# Patient Record
Sex: Female | Born: 1955
Health system: Southern US, Community
[De-identification: ages and names within clinical notes are randomized; demographics above are authoritative.]

## PROBLEM LIST (undated history)

## (undated) DIAGNOSIS — Z8619 Personal history of other infectious and parasitic diseases: Secondary | ICD-10-CM

## (undated) DIAGNOSIS — R87629 Unspecified abnormal cytological findings in specimens from vagina: Secondary | ICD-10-CM

## (undated) DIAGNOSIS — E559 Vitamin D deficiency, unspecified: Secondary | ICD-10-CM

## (undated) DIAGNOSIS — N6019 Diffuse cystic mastopathy of unspecified breast: Secondary | ICD-10-CM

## (undated) DIAGNOSIS — K760 Fatty (change of) liver, not elsewhere classified: Secondary | ICD-10-CM

## (undated) DIAGNOSIS — F32A Depression, unspecified: Secondary | ICD-10-CM

## (undated) DIAGNOSIS — F329 Major depressive disorder, single episode, unspecified: Secondary | ICD-10-CM

## (undated) DIAGNOSIS — E785 Hyperlipidemia, unspecified: Secondary | ICD-10-CM

## (undated) DIAGNOSIS — R311 Benign essential microscopic hematuria: Secondary | ICD-10-CM

## (undated) DIAGNOSIS — T7840XA Allergy, unspecified, initial encounter: Secondary | ICD-10-CM

## (undated) HISTORY — DX: Diffuse cystic mastopathy of unspecified breast: N60.19

## (undated) HISTORY — DX: Major depressive disorder, single episode, unspecified: F32.9

## (undated) HISTORY — DX: Benign essential microscopic hematuria: R31.1

## (undated) HISTORY — PX: TONSILLECTOMY: SHX5217

## (undated) HISTORY — DX: Personal history of other infectious and parasitic diseases: Z86.19

## (undated) HISTORY — DX: Fatty (change of) liver, not elsewhere classified: K76.0

## (undated) HISTORY — DX: Hyperlipidemia, unspecified: E78.5

## (undated) HISTORY — DX: Allergy, unspecified, initial encounter: T78.40XA

## (undated) HISTORY — DX: Depression, unspecified: F32.A

## (undated) HISTORY — DX: Unspecified abnormal cytological findings in specimens from vagina: R87.629

## (undated) HISTORY — DX: Vitamin D deficiency, unspecified: E55.9

---

## 2007-06-24 HISTORY — PX: ABLATION: SHX5711

## 2013-11-21 ENCOUNTER — Encounter: Payer: Self-pay | Admitting: Family

## 2013-11-21 ENCOUNTER — Telehealth: Payer: Self-pay | Admitting: *Deleted

## 2013-11-21 ENCOUNTER — Ambulatory Visit (INDEPENDENT_AMBULATORY_CARE_PROVIDER_SITE_OTHER): Payer: Self-pay | Admitting: Family

## 2013-11-21 ENCOUNTER — Other Ambulatory Visit: Payer: Self-pay | Admitting: Family

## 2013-11-21 VITALS — BP 120/82 | HR 64 | Temp 98.2°F | Resp 14 | Ht 62.25 in | Wt 205.1 lb

## 2013-11-21 DIAGNOSIS — R311 Benign essential microscopic hematuria: Secondary | ICD-10-CM

## 2013-11-21 DIAGNOSIS — E785 Hyperlipidemia, unspecified: Secondary | ICD-10-CM

## 2013-11-21 DIAGNOSIS — R3129 Other microscopic hematuria: Secondary | ICD-10-CM

## 2013-11-21 DIAGNOSIS — E559 Vitamin D deficiency, unspecified: Secondary | ICD-10-CM | POA: Insufficient documentation

## 2013-11-21 DIAGNOSIS — R635 Abnormal weight gain: Secondary | ICD-10-CM

## 2013-11-21 DIAGNOSIS — F3289 Other specified depressive episodes: Secondary | ICD-10-CM

## 2013-11-21 DIAGNOSIS — F329 Major depressive disorder, single episode, unspecified: Secondary | ICD-10-CM

## 2013-11-21 DIAGNOSIS — F32A Depression, unspecified: Secondary | ICD-10-CM

## 2013-11-21 LAB — HEPATIC FUNCTION PANEL
ALBUMIN: 4.2 g/dL (ref 3.5–5.2)
ALK PHOS: 57 U/L (ref 39–117)
ALT: 28 U/L (ref 0–35)
AST: 21 U/L (ref 0–37)
BILIRUBIN INDIRECT: 0.4 mg/dL (ref 0.2–1.2)
Bilirubin, Direct: 0.1 mg/dL (ref 0.0–0.3)
TOTAL PROTEIN: 6.9 g/dL (ref 6.0–8.3)
Total Bilirubin: 0.5 mg/dL (ref 0.2–1.2)

## 2013-11-21 MED ORDER — SERTRALINE HCL 50 MG PO TABS: 50.0000 mg | ORAL_TABLET | Freq: Every day | ORAL | Status: DC

## 2013-11-21 MED ORDER — SIMVASTATIN 20 MG PO TABS: 20.0000 mg | ORAL_TABLET | Freq: Every day | ORAL | Status: DC

## 2013-11-21 NOTE — Assessment & Plan Note (Signed)
Discussed counting calories using myfitness pal and regular exercise. Check TSH.

## 2013-11-21 NOTE — Assessment & Plan Note (Signed)
Reports extensive negative work up in the past and was told benign microscopic hematuria.

## 2013-11-21 NOTE — Telephone Encounter (Signed)
Spoke with phlebotomist (Katrina), she will add test to specimen.

## 2013-11-21 NOTE — Telephone Encounter (Signed)
Message copied by Ronny Flurry on Mon Nov 21, 2013 11:34 AM ------      Message from: O'SULLIVAN, MELISSA      Created: Mon Nov 21, 2013 10:43 AM       Can the lab add on vit D to blood draw please ? Dx is vit d def thanks ------

## 2013-11-21 NOTE — Patient Instructions (Signed)
Please complete lab work prior to leaving. Return to the lab in 1 month fasting for cholesterol level. Schedule physical at your convenience. Welcome to [facility]!

## 2013-11-21 NOTE — Assessment & Plan Note (Signed)
Has been on statin intermittently for 1-2 months. Resume nightly. Tolerating. Check LFT. I have asked her to return in 1 month for FLP.

## 2013-11-21 NOTE — Progress Notes (Signed)
Subjective:    Patient ID: Amanda Oconnor, female    DOB: [birth year], 58 y.o.   MRN: [id number]  HPI  Ms. Amanda Oconnor is a 58 yr old female who presents today to establish care.  1) Depression-  Currently maintained on zoloft. She started this >1 yr ago.  Cared for her mother after her father passed away who had stage 4 lung cancer. Mother has since passed away. She gained a lot of weight at that time.  She was also dealing with brother's mental health issues.  He died last year from a stroke. 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 She was laid off 1 week after her mother passed away.  For the last year she has been intermittently unemployed.  Saw a therapist who recommended that she start zoloft and referred her to an MD to prescribe.  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  Her husband remains in [place] for now. They are hoping to stay here permanently depending on job opportunity.   2) Vit D deficiency- takes weekly supplement.  She started taking 3 months ago.    3) Hyperlipidemia-  On simvastatin.  Reports that she has not been taking regularly with move. Started 2 months ago.  Last dose >1 week ago.  Denies myalgia.    4) Microscopic hematuria- reports that she had an extensive work up.  Had a scan and developed shortness of breath.    5) weight gain- reports steady weight gain over the last 5 years and 20 pounds in the last year. Walks her dogs regularly and eats healthy.  Review of Systems See HPI  Past Medical History  Diagnosis Date  . History of chicken pox   . Depression   . Allergy   . Hyperlipidemia   . Vitamin D deficiency   . Fibrocystic breast disease     History   Social History  . Marital Status: Married    Spouse Name: N/A    Number of Children: N/A  . Years of Education: N/A   Occupational History  . Not on file.   Social History Main Topics  . Smoking status: Never Smoker   . Smokeless  tobacco: Never Used  . Alcohol Use: Yes     Comment: occasional ( twice a month)  . Drug Use: Not on file  . Sexual Activity: Not on file   Other Topics Concern  . Not on file   Social History Narrative  . No narrative on file    Past Surgical History  Procedure Laterality Date  . Tonsillectomy      age 69  . Ablation  2009    Family History  Problem Relation Age of Onset  . Arthritis Mother   . Cancer Mother     lung  . Hyperlipidemia Mother   . Heart disease Mother   . Diabetes Mother   . Tuberculosis Mother   . Fibrocystic breast disease Mother   . Cancer Father     prostate  . Tuberculosis Father   . Alcohol abuse Father   . Gout Father   . Heart disease Sister   . Stroke Brother   . Heart disease Brother     Allergies  Allergen Reactions  . Contrast Media [Iodinated Diagnostic Agents] Shortness Of Breath  . Penicillins Hives  . Red Dye Rash  . Sulfa Antibiotics Rash    No current outpatient prescriptions on file prior  to visit.   No current facility-administered medications on file prior to visit.    BP 120/82  Pulse 64  Temp(Src) 98.2 F (36.8 C) (Oral)  Resp 14  Ht 5' 2.25" (1.581 m)  Wt 205 lb 1.3 oz (93.024 kg)  BMI 37.22 kg/m2  SpO2 98%       Objective:   Physical Exam  Constitutional: She is oriented to person, place, and time. She appears well-developed and well-nourished. No distress.  HENT:  Head: Normocephalic and atraumatic.  Right Ear: Tympanic membrane and ear canal normal.  Left Ear: Tympanic membrane and ear canal normal.  Mouth/Throat: No oropharyngeal exudate, posterior oropharyngeal edema or posterior oropharyngeal erythema.  Cardiovascular: Normal rate and regular rhythm.   No murmur heard. Pulmonary/Chest: Effort normal and breath sounds normal. No respiratory distress. She has no wheezes. She has no rales. She exhibits no tenderness.  Musculoskeletal: She exhibits no edema.  Lymphadenopathy:    She has no  cervical adenopathy.  Neurological: She is alert and oriented to person, place, and time.  Skin: Skin is warm and dry.  Psychiatric: She has a normal mood and affect. Her behavior is normal. Judgment and thought content normal.          Assessment & Plan:

## 2013-11-21 NOTE — Progress Notes (Signed)
Pre visit review using our clinic review tool, if applicable. No additional management support is needed unless otherwise documented below in the visit note. 

## 2013-11-21 NOTE — Assessment & Plan Note (Signed)
Will see if lab can add on vit d level.

## 2013-11-21 NOTE — Assessment & Plan Note (Signed)
Maintained on zoloft. Stable. Continue same.

## 2013-11-22 ENCOUNTER — Encounter: Payer: Self-pay | Admitting: Family

## 2013-11-22 LAB — TSH: TSH: 2.721 u[IU]/mL (ref 0.350–4.500)

## 2013-11-22 LAB — VITAMIN D 25 HYDROXY (VIT D DEFICIENCY, FRACTURES): Vit D, 25-Hydroxy: 39 ng/mL (ref 30–89)

## 2013-11-28 ENCOUNTER — Telehealth: Payer: Self-pay | Admitting: *Deleted

## 2013-11-28 DIAGNOSIS — E785 Hyperlipidemia, unspecified: Secondary | ICD-10-CM

## 2013-11-28 NOTE — Telephone Encounter (Signed)
Message copied by Ronny Flurry on Mon Nov 28, 2013  8:12 AM ------      Message from: O'SULLIVAN, MELISSA      Created: Mon Nov 21, 2013 10:42 AM       Pt will follow up in 1 month for flp dx hyperlipidemia please.  ------

## 2014-05-30 ENCOUNTER — Other Ambulatory Visit: Payer: Self-pay | Admitting: Family

## 2014-06-19 ENCOUNTER — Telehealth: Payer: Self-pay | Admitting: Family

## 2014-06-19 ENCOUNTER — Other Ambulatory Visit: Payer: Self-pay | Admitting: Family

## 2014-06-19 NOTE — Telephone Encounter (Signed)
Left a message for call back.  

## 2014-06-19 NOTE — Telephone Encounter (Signed)
Needs an office visit for recheck of chronic conditions with Earlie Counts.

## 2014-06-26 ENCOUNTER — Ambulatory Visit (INDEPENDENT_AMBULATORY_CARE_PROVIDER_SITE_OTHER): Payer: BLUE CROSS/BLUE SHIELD | Admitting: Family

## 2014-06-26 ENCOUNTER — Encounter: Payer: Self-pay | Admitting: Family

## 2014-06-26 VITALS — BP 104/78 | HR 62 | Temp 98.2°F | Resp 16 | Ht 62.25 in | Wt 210.4 lb

## 2014-06-26 DIAGNOSIS — E559 Vitamin D deficiency, unspecified: Secondary | ICD-10-CM

## 2014-06-26 DIAGNOSIS — F32A Depression, unspecified: Secondary | ICD-10-CM

## 2014-06-26 DIAGNOSIS — E162 Hypoglycemia, unspecified: Secondary | ICD-10-CM | POA: Insufficient documentation

## 2014-06-26 DIAGNOSIS — F329 Major depressive disorder, single episode, unspecified: Secondary | ICD-10-CM

## 2014-06-26 DIAGNOSIS — E785 Hyperlipidemia, unspecified: Secondary | ICD-10-CM

## 2014-06-26 NOTE — Patient Instructions (Addendum)
Please schedule fasting lab work on your way out. Schedule physical at the front desk. Happy New Year!

## 2014-06-26 NOTE — Progress Notes (Signed)
Pre visit review using our clinic review tool, if applicable. No additional management support is needed unless otherwise documented below in the visit note. 

## 2014-06-26 NOTE — Progress Notes (Signed)
Subjective:    Patient ID: Amanda Oconnor, female    DOB: 09/04/1955, 59 y.o.   MRN: [id number]  HPI  Amanda Oconnor is a 59 yr old female who presents today for follow up.  1) Depression-maintained on zoloft. Husband is now here in [place].  She is working in [employer] in [employer]. 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  Reports that she feels that the zoloft is helping.   2) hyperlipidemia- has been out of  Simvastatin for several months. Expects her cholesterol to be elevated.  3) Hypoglycemia- reports that if she eats something really sweet, she will later get shakey. Has to avoid things such as oj.   She reports that she did have some bronchitis symptoms recently and was evaluated by Urgent care who placed her on omnicef.  Review of Systems See HPI  Past Medical History  Diagnosis Date  . History of chicken pox   . Depression   . Allergy   . Hyperlipidemia   . Vitamin D deficiency   . Fibrocystic breast disease   . Benign microscopic hematuria     per pt she has had extensive w/u which was negative    History   Social History  . Marital Status: Married    Spouse Name: N/A    Number of Children: N/A  . Years of Education: N/A   Occupational History  . Not on file.   Social History Main Topics  . Smoking status: Never Smoker   . Smokeless tobacco: Never Used  . Alcohol Use: Yes     Comment: occasional ( twice a month)  . Drug Use: Not on file  . Sexual Activity: Not on file   Other Topics Concern  . Not on file   Social History Narrative   3 children   5 grandchildren   Married second marriage.  Husband is in [place].   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 Tonsillectomy      age 25  . Ablation  2009    Family History  Problem Relation Age of Onset  . Arthritis Mother   . Cancer Mother     lung  .  Hyperlipidemia Mother   . Heart disease Mother   . Diabetes Mother   . Tuberculosis Mother   . Fibrocystic breast disease Mother   . Cancer Father     prostate  . Tuberculosis Father   . Alcohol abuse Father   . Gout Father   . Heart disease Sister   . Stroke Brother   . Heart disease Brother     Allergies  Allergen Reactions  . Contrast Media [Iodinated Diagnostic Agents] Shortness Of Breath  . Penicillins Hives  . Sulfa Antibiotics Rash    Current Outpatient Prescriptions on File Prior to Visit  Medication Sig Dispense Refill  . sertraline (ZOLOFT) 50 MG tablet TAKE 1 TABLET (50 MG TOTAL) BY MOUTH DAILY. 30 tablet 0  . Vitamin D, Ergocalciferol, (DRISDOL) 50000 UNITS CAPS capsule Take 50,000 Units by mouth every 7 (seven) days.    . simvastatin (ZOCOR) 20 MG tablet Take 1 tablet (20 mg total) by mouth daily. (Patient not taking: Reported on 06/26/2014) 30 tablet 5   No current facility-administered medications on file prior to visit.    BP 104/78 mmHg  Pulse 62  Temp(Src)  98.2 F (36.8 C) (Oral)  Resp 16  Ht 5' 2.25" (1.581 m)  Wt 210 lb 6.4 oz (95.437 kg)  BMI 38.18 kg/m2  SpO2 97%       Objective:   Physical Exam  Constitutional: She is oriented to person, place, and time. She appears well-developed and well-nourished. No distress.  HENT:  Head: Normocephalic and atraumatic.  Cardiovascular: Normal rate and regular rhythm.   No murmur heard. Pulmonary/Chest: Effort normal and breath sounds normal. No respiratory distress. She has no wheezes. She has no rales. She exhibits no tenderness.  Musculoskeletal: She exhibits no edema.  Lymphadenopathy:    She has no cervical adenopathy.  Neurological: She is alert and oriented to person, place, and time.  Psychiatric: She has a normal mood and affect. Her behavior is normal. Judgment and thought content normal.          Assessment & Plan:  Declines flu shot

## 2014-06-26 NOTE — Assessment & Plan Note (Signed)
Will obtain A1C to further evaluate.

## 2014-06-26 NOTE — Assessment & Plan Note (Signed)
Not currently taking supplement. Check follow up vit D level.

## 2014-06-26 NOTE — Telephone Encounter (Signed)
Pt seen for annual exam today.

## 2014-06-26 NOTE — Assessment & Plan Note (Signed)
She will return fasting for lipid panel. If elevated, plan to repeat statin.

## 2014-06-26 NOTE — Assessment & Plan Note (Signed)
Stable on zoloft. Continue same.  

## 2014-07-04 ENCOUNTER — Telehealth: Payer: Self-pay | Admitting: Family

## 2014-07-04 ENCOUNTER — Other Ambulatory Visit: Payer: Self-pay

## 2014-07-04 DIAGNOSIS — E559 Vitamin D deficiency, unspecified: Secondary | ICD-10-CM

## 2014-07-04 DIAGNOSIS — E785 Hyperlipidemia, unspecified: Secondary | ICD-10-CM

## 2014-07-04 LAB — LIPID PANEL
Cholesterol: 270 mg/dL — ABNORMAL HIGH (ref 0–200)
HDL: 45.9 mg/dL (ref >39.00)
LDL Cholesterol: 199 mg/dL — ABNORMAL HIGH (ref 0–99)
NonHDL: 224.1
Total CHOL/HDL Ratio: 6
Triglycerides: 124 mg/dL (ref 0.0–149.0)
VLDL: 24.8 mg/dL (ref 0.0–40.0)

## 2014-07-04 LAB — HEMOGLOBIN A1C: Hgb A1c MFr Bld: 6.3 % (ref 4.6–6.5)

## 2014-07-04 LAB — VITAMIN D 25 HYDROXY (VIT D DEFICIENCY, FRACTURES): VITD: 17.79 ng/mL — ABNORMAL LOW (ref 30.00–100.00)

## 2014-07-04 MED ORDER — VITAMIN D (ERGOCALCIFEROL) 1.25 MG (50000 UNIT) PO CAPS
50000.0000 [IU] | ORAL_CAPSULE | ORAL | Status: DC
Start: 2014-07-04 — End: 2015-02-07

## 2014-07-04 NOTE — Telephone Encounter (Signed)
Vitamin D is low.  Start weekly supplement (rx sent) x 12 weeks, repeat vit D in 12 weeks.  Cholesterol is elevated.  Restart statin. Repeat flp in 12 weeks.  Dx hyperlipidemia.

## 2014-07-05 NOTE — Telephone Encounter (Signed)
Left message for pt to return my call.

## 2014-07-07 MED ORDER — SIMVASTATIN 20 MG PO TABS: 20.0000 mg | ORAL_TABLET | Freq: Every day | ORAL | Status: DC

## 2014-07-07 NOTE — Telephone Encounter (Signed)
Notified pt and she voices understanding. Requests rx of simvastatin; refill sent. Lab apt scheduled for 09/29/14 and future lab order entered.

## 2014-07-24 ENCOUNTER — Encounter: Payer: BC Managed Care – PPO | Admitting: Family

## 2014-08-03 ENCOUNTER — Other Ambulatory Visit: Payer: Self-pay | Admitting: Family

## 2014-08-07 ENCOUNTER — Telehealth: Payer: Self-pay | Admitting: Family

## 2014-08-07 NOTE — Telephone Encounter (Signed)
Caller name: Tabitha, Riggins Relation to pt: self Call back number: [phone] Pharmacy: [id number] - [place], [place] - [address] AT [address]OSS [address] [phone] (Phone) [phone] (Fax)         Reason for call:  Pt requesting a refill sertraline (ZOLOFT) 50 MG tablet

## 2014-08-07 NOTE — Telephone Encounter (Signed)
Refill sent today. See 08/03/14 refill encounter. Notified pt.

## 2014-08-09 ENCOUNTER — Encounter: Payer: Self-pay | Admitting: Family

## 2014-08-09 ENCOUNTER — Ambulatory Visit (INDEPENDENT_AMBULATORY_CARE_PROVIDER_SITE_OTHER): Payer: BLUE CROSS/BLUE SHIELD | Admitting: Family

## 2014-08-09 VITALS — BP 110/60 | HR 67 | Temp 98.1°F | Resp 16 | Ht 62.5 in | Wt 212.0 lb

## 2014-08-09 DIAGNOSIS — E785 Hyperlipidemia, unspecified: Secondary | ICD-10-CM

## 2014-08-09 DIAGNOSIS — Z1211 Encounter for screening for malignant neoplasm of colon: Secondary | ICD-10-CM

## 2014-08-09 DIAGNOSIS — E559 Vitamin D deficiency, unspecified: Secondary | ICD-10-CM

## 2014-08-09 DIAGNOSIS — J329 Chronic sinusitis, unspecified: Secondary | ICD-10-CM | POA: Insufficient documentation

## 2014-08-09 DIAGNOSIS — Z0000 Encounter for general adult medical examination without abnormal findings: Secondary | ICD-10-CM | POA: Insufficient documentation

## 2014-08-09 LAB — LIPID PANEL
Cholesterol: 180 mg/dL (ref 0–200)
HDL: 47.4 mg/dL (ref >39.00)
LDL CALC: 117 mg/dL — AB (ref 0–99)
NonHDL: 132.6
Total CHOL/HDL Ratio: 4
Triglycerides: 79 mg/dL (ref 0.0–149.0)
VLDL: 15.8 mg/dL (ref 0.0–40.0)

## 2014-08-09 LAB — CBC WITH DIFFERENTIAL/PLATELET
BASOS ABS: 0 10*3/uL (ref 0.0–0.1)
Basophils Relative: 0.4 % (ref 0.0–3.0)
EOS PCT: 1.3 % (ref 0.0–5.0)
Eosinophils Absolute: 0.1 10*3/uL (ref 0.0–0.7)
HCT: 41.9 % (ref 36.0–46.0)
Hemoglobin: 14.1 g/dL (ref 12.0–15.0)
Lymphocytes Relative: 31.3 % (ref 12.0–46.0)
Lymphs Abs: 2.1 10*3/uL (ref 0.7–4.0)
MCHC: 33.7 g/dL (ref 30.0–36.0)
MCV: 88.6 fl (ref 78.0–100.0)
MONOS PCT: 7.8 % (ref 3.0–12.0)
Monocytes Absolute: 0.5 10*3/uL (ref 0.1–1.0)
NEUTROS PCT: 59.2 % (ref 43.0–77.0)
Neutro Abs: 3.9 10*3/uL (ref 1.4–7.7)
PLATELETS: 261 10*3/uL (ref 150.0–400.0)
RBC: 4.73 Mil/uL (ref 3.87–5.11)
RDW: 12.6 % (ref 11.5–15.5)
WBC: 6.6 10*3/uL (ref 4.0–10.5)

## 2014-08-09 LAB — URINALYSIS, ROUTINE W REFLEX MICROSCOPIC
Bilirubin Urine: NEGATIVE
KETONES UR: NEGATIVE
Leukocytes, UA: NEGATIVE
Nitrite: NEGATIVE
RBC / HPF: NONE SEEN (ref 0–?)
Specific Gravity, Urine: 1.01 (ref 1.000–1.030)
TOTAL PROTEIN, URINE-UPE24: NEGATIVE
URINE GLUCOSE: NEGATIVE
Urobilinogen, UA: 0.2 (ref 0.0–1.0)
WBC, UA: NONE SEEN (ref 0–?)
pH: 7 (ref 5.0–8.0)

## 2014-08-09 LAB — HEPATIC FUNCTION PANEL
ALBUMIN: 4.1 g/dL (ref 3.5–5.2)
ALK PHOS: 58 U/L (ref 39–117)
ALT: 31 U/L (ref 0–35)
AST: 21 U/L (ref 0–37)
Bilirubin, Direct: 0.1 mg/dL (ref 0.0–0.3)
TOTAL PROTEIN: 7.3 g/dL (ref 6.0–8.3)
Total Bilirubin: 0.5 mg/dL (ref 0.2–1.2)

## 2014-08-09 LAB — TSH: TSH: 2.7 u[IU]/mL (ref 0.35–4.50)

## 2014-08-09 LAB — VITAMIN D 25 HYDROXY (VIT D DEFICIENCY, FRACTURES): VITD: 33.57 ng/mL (ref 30.00–100.00)

## 2014-08-09 MED ORDER — CALCIUM CARBONATE-VITAMIN D 600-400 MG-UNIT PO TABS: 1.0000 | ORAL_TABLET | Freq: Two times a day (BID) | ORAL | Status: DC

## 2014-08-09 MED ORDER — FLUTICASONE PROPIONATE 50 MCG/ACT NA SUSP: 2.0000 | Freq: Every day | NASAL | Status: DC

## 2014-08-09 MED ORDER — SERTRALINE HCL 100 MG PO TABS: 100.0000 mg | ORAL_TABLET | Freq: Every day | ORAL | Status: DC

## 2014-08-09 NOTE — Progress Notes (Signed)
Pre visit review using our clinic review tool, if applicable. No additional management support is needed unless otherwise documented below in the visit note. 

## 2014-08-09 NOTE — Assessment & Plan Note (Signed)
Discussed adding flonase and possibly referral to ENT, declines ENT referral at this time but is agreeable to flonase.

## 2014-08-09 NOTE — Patient Instructions (Signed)
Increase zoloft from 50mg  to 100mg .  Complete lab work prior to leaving. Add flonase  2 sprays each nostril once daily.  Follow up in 6 months.

## 2014-08-09 NOTE — Assessment & Plan Note (Signed)
Declines colo advised her to complete IFOB though. Refer for dexa, mammogram. Continue healthy diet, add regular exercise. Add caltrate + D 600 mg bid for bone health.

## 2014-08-09 NOTE — Progress Notes (Signed)
Subjective:    Patient ID: Amanda Oconnor, female    DOB: 1956/01/07, 59 y.o.   MRN: [id number]  HPI Amanda Oconnor presents today for complete physical.  Immunizations: declines flu vaccine. Td-2008 Diet: eats fruits/vegetables. Eats healthy. Exercise: not exercising.  Colonoscopy: has never had one; declines Dexa: has never had. Pap Smear: 2 years ago, desires referral to GYN. Mammogram: 2 years ago  1. Depression/anxiety: Maintained on sertraline 50 mg for depression since 2012/10/13 when her mother died. She feels that is as been helpful. Reports high anxiety with her job. Had an emotional outburst at work last week.Has trouble multi-tasking and has some problems with memory since 10/13/2013 when she began current job.  Also had depressed episode over Christmas.  Has not developed support group here. Children live out of town.  2. Hyperlipidemia: Reports compliance with simvastatin. Denies myalgia. Lab Results  Component Value Date   CHOL 270* 07/04/2014   HDL 45.90 07/04/2014   LDLCALC 199* 07/04/2014   TRIG 124.0 07/04/2014   CHOLHDL 6 07/04/2014    4 sinus infections in 1 year  Immunizations:  Tetanus up to date Diet: reports diet is good Exercise: no Colonoscopy:  Declines, agreeable to ifob Dexa:due  Pap Smear: due, refer to gyn Mammogram: due     Review of Systems  Constitutional: Positive for fatigue. Negative for fever and unexpected weight change.  HENT: Positive for congestion.   Eyes: Negative for visual disturbance.  Respiratory: Negative for cough and shortness of breath.   Cardiovascular: Positive for chest pain. Negative for palpitations and leg swelling.       Feels chest pain is related to anxiety. Has had during very anxious episodes.  Gastrointestinal: Negative for abdominal pain, diarrhea and constipation.  Genitourinary: Negative for dysuria and frequency.       Reports vaginal dryness.  Musculoskeletal: Negative for myalgias and arthralgias.    Skin: Negative for rash.  Neurological: Negative for headaches.  Hematological: Does not bruise/bleed easily.   Past Medical History  Diagnosis Date  . History of chicken pox   . Depression   . Allergy   . Hyperlipidemia   . Vitamin D deficiency   . Fibrocystic breast disease   . Benign microscopic hematuria     per pt she has had extensive w/u which was negative    History   Social History  . Marital Status: Married    Spouse Name: N/A  . Number of Children: N/A  . Years of Education: N/A   Occupational History  . Not on file.   Social History Main Topics  . Smoking status: Never Smoker   . Smokeless tobacco: Never Used  . Alcohol Use: Yes     Comment: occasional ( twice a month)  . Drug Use: Not on file  . Sexual Activity: Not on file   Other Topics Concern  . Not on file   Social History Narrative   3 children   5 grandchildren   Married second marriage.  Husband is in [place].   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 Tonsillectomy      age 11  . Ablation  10/14/07    Family History  Problem Relation Age of Onset  . Arthritis Mother   . Cancer Mother     lung  . Hyperlipidemia Mother   .  Heart disease Mother   . Diabetes Mother   . Tuberculosis Mother   . Fibrocystic breast disease Mother   . Cancer Father     prostate  . Tuberculosis Father   . Alcohol abuse Father   . Gout Father   . Heart disease Sister   . Stroke Brother   . Heart disease Brother     Allergies  Allergen Reactions  . Contrast Media [Iodinated Diagnostic Agents] Shortness Of Breath  . Penicillins Hives  . Sulfa Antibiotics Rash    Current Outpatient Prescriptions on File Prior to Visit  Medication Sig Dispense Refill  . simvastatin (ZOCOR) 20 MG tablet Take 1 tablet (20 mg total) by mouth daily. 30 tablet 5  . Vitamin D,  Ergocalciferol, (DRISDOL) 50000 UNITS CAPS capsule Take 1 capsule (50,000 Units total) by mouth every 7 (seven) days. 12 capsule 0   No current facility-administered medications on file prior to visit.    BP 110/60 mmHg  Pulse 67  Temp(Src) 98.1 F (36.7 C) (Oral)  Resp 16  Ht 5' 2.5" (1.588 m)  Wt 212 lb (96.163 kg)  BMI 38.13 kg/m2  SpO2 98%       Objective:   Physical Exam  Physical Exam  Constitutional: She is oriented to person, place, and time. She appears well-developed and well-nourished. No distress.  HENT:  Head: Normocephalic and atraumatic.  Right Ear: Tympanic membrane and ear canal normal.  Left Ear: Tympanic membrane and ear canal normal.  Mouth/Throat: Oropharynx is clear and moist.  Eyes: Pupils are equal, round, and reactive to light. No scleral icterus.  Neck: Normal range of motion. No thyromegaly present.  Cardiovascular: Normal rate and regular rhythm.   No murmur heard. Pulmonary/Chest: Effort normal and breath sounds normal. No respiratory distress. He has no wheezes. She has no rales. She exhibits no tenderness.  Abdominal: Soft. Bowel sounds are normal. He exhibits no distension and no mass. There is no tenderness. There is no rebound and no guarding.  Musculoskeletal: She exhibits no edema.  Lymphadenopathy:    She has no cervical adenopathy.  Neurological: She is alert and oriented to person, place, and time. She has normal reflexes. She exhibits normal muscle tone. Coordination normal.  Skin: Skin is warm and dry.  Psychiatric: She has a normal mood and affect. Her behavior is normal. Judgment and thought content normal.  Breasts: Examined lying Right: Without masses, retractions, discharge or axillary adenopathy.  Left: Without masses, retractions, discharge or axillary adenopathy.  Inguinal/mons: Normal without inguinal adenopathy  External genitalia: Normal  BUS/Urethra/Skene's glands: Normal  Bladder: Normal  Vagina: Normal  Cervix:  Normal  Uterus: normal in size, shape and contour. Midline and mobile  Adnexa/parametria:  Rt: Without masses or tenderness.  Lt: Without masses or tenderness.  Anus and perineum: Normal           Assessment & Plan:         Assessment & Plan:

## 2014-08-09 NOTE — Assessment & Plan Note (Signed)
Tolerating statin, continue same.  

## 2014-08-09 NOTE — Addendum Note (Signed)
Addended by: Peggyann Shoals on: 08/09/2014 02:35 PM   Modules accepted: Orders

## 2014-08-10 ENCOUNTER — Encounter: Payer: Self-pay | Admitting: Family

## 2014-08-23 ENCOUNTER — Other Ambulatory Visit: Payer: BLUE CROSS/BLUE SHIELD

## 2014-08-23 DIAGNOSIS — Z1211 Encounter for screening for malignant neoplasm of colon: Secondary | ICD-10-CM

## 2014-08-23 LAB — FECAL OCCULT BLOOD, IMMUNOCHEMICAL: Fecal Occult Bld: POSITIVE — AB

## 2014-08-25 ENCOUNTER — Encounter: Payer: Self-pay | Admitting: Family

## 2014-08-25 ENCOUNTER — Telehealth: Payer: Self-pay | Admitting: Family

## 2014-08-25 DIAGNOSIS — R195 Other fecal abnormalities: Secondary | ICD-10-CM | POA: Insufficient documentation

## 2014-08-25 NOTE — Telephone Encounter (Signed)
ifob shows blood in her stool.  I would highly recommend referral to GI for further evaluation.  I have pended referral below.

## 2014-08-25 NOTE — Telephone Encounter (Signed)
Notified pt. She states she has a lot of medical bills that she is currently paying and will get back with [place] when she is ready to proceed with GI referral.  Advised pt of potential causes for blood stool and importance of having further workup. Pt voices understanding and state she will let us know when she is ready to proceed.

## 2014-08-30 ENCOUNTER — Encounter: Payer: Self-pay | Admitting: Family

## 2014-09-22 HISTORY — PX: TOOTH EXTRACTION: SHX859

## 2014-09-29 ENCOUNTER — Other Ambulatory Visit: Payer: BLUE CROSS/BLUE SHIELD

## 2014-11-15 ENCOUNTER — Emergency Department (HOSPITAL_COMMUNITY)
Admission: EM | Admit: 2014-11-15 | Discharge: 2014-11-15 | Disposition: A | Discharge status: Home / Self Care | Payer: Worker's Compensation | Attending: Emergency Medicine | Admitting: Emergency Medicine

## 2014-11-15 ENCOUNTER — Emergency Department (HOSPITAL_COMMUNITY): Payer: Worker's Compensation

## 2014-11-15 ENCOUNTER — Encounter (HOSPITAL_COMMUNITY): Payer: Self-pay | Admitting: Family Medicine

## 2014-11-15 DIAGNOSIS — E785 Hyperlipidemia, unspecified: Secondary | ICD-10-CM | POA: Diagnosis not present

## 2014-11-15 DIAGNOSIS — Y990 Civilian activity done for income or pay: Secondary | ICD-10-CM | POA: Diagnosis not present

## 2014-11-15 DIAGNOSIS — Y9301 Activity, walking, marching and hiking: Secondary | ICD-10-CM | POA: Diagnosis not present

## 2014-11-15 DIAGNOSIS — W010XXA Fall on same level from slipping, tripping and stumbling without subsequent striking against object, initial encounter: Secondary | ICD-10-CM | POA: Insufficient documentation

## 2014-11-15 DIAGNOSIS — S8992XA Unspecified injury of left lower leg, initial encounter: Secondary | ICD-10-CM | POA: Diagnosis present

## 2014-11-15 DIAGNOSIS — Z79899 Other long term (current) drug therapy: Secondary | ICD-10-CM | POA: Diagnosis not present

## 2014-11-15 DIAGNOSIS — E559 Vitamin D deficiency, unspecified: Secondary | ICD-10-CM | POA: Diagnosis not present

## 2014-11-15 DIAGNOSIS — E329 Disease of thymus, unspecified: Secondary | ICD-10-CM | POA: Diagnosis not present

## 2014-11-15 DIAGNOSIS — Z7951 Long term (current) use of inhaled steroids: Secondary | ICD-10-CM | POA: Insufficient documentation

## 2014-11-15 DIAGNOSIS — Z880 Allergy status to penicillin: Secondary | ICD-10-CM | POA: Insufficient documentation

## 2014-11-15 DIAGNOSIS — Z8742 Personal history of other diseases of the female genital tract: Secondary | ICD-10-CM | POA: Insufficient documentation

## 2014-11-15 DIAGNOSIS — Z8619 Personal history of other infectious and parasitic diseases: Secondary | ICD-10-CM | POA: Insufficient documentation

## 2014-11-15 DIAGNOSIS — Y92000 Kitchen of unspecified non-institutional (private) residence as  the place of occurrence of the external cause: Secondary | ICD-10-CM | POA: Insufficient documentation

## 2014-11-15 MED ORDER — IBUPROFEN 800 MG PO TABS: 800.0000 mg | ORAL_TABLET | Freq: Three times a day (TID) | ORAL | Status: DC | PRN

## 2014-11-15 MED ORDER — HYDROCODONE-ACETAMINOPHEN 5-325 MG PO TABS: 1.0000 | ORAL_TABLET | Freq: Four times a day (QID) | ORAL | Status: DC | PRN

## 2014-11-15 NOTE — ED Notes (Signed)
Per EMS, pt was walking and had a fall. Their was water on the floor and she landed on her left knee. Denies any other injuries. IV was established and given Fentanyl 239mcq (divided doses of 49mcq) en route. Patients oxygen saturation dropped in the 80's when given medication. Placed on 2L oxygen via [place] and O2 increased.

## 2014-11-15 NOTE — ED Provider Notes (Signed)
CSN: [id number]     Arrival date & time 11/15/14  1934 History   First MD Initiated Contact with Patient 11/15/14 2000     Chief Complaint  Patient presents with  . Fall  . Knee Injury     (Consider location/radiation/quality/duration/timing/severity/associated sxs/prior Treatment) HPI Patient presents to the emergency department with right knee pain from a fall that occurred just prior to arrival.  The patient states she was at work when she slipped on a wet floor in the kitchen and she landed directly on her left knee.  The patient states that she was unable to get off the floor.  EMS was called and they gave her 200 g of fentanyl.  Patient states that movement and palpation make the pain worse.  The patient denies any other injuries.  She states that she has never injured injured this knee in the past Past Medical History  Diagnosis Date  . History of chicken pox   . Depression   . Allergy   . Hyperlipidemia   . Vitamin D deficiency   . Fibrocystic breast disease   . Benign microscopic hematuria     per pt she has had extensive w/u which was negative   Past Surgical History  Procedure Laterality Date  . Tonsillectomy      age 30  . Ablation  2009   Family History  Problem Relation Age of Onset  . Arthritis Mother   . Cancer Mother     lung  . Hyperlipidemia Mother   . Heart disease Mother   . Diabetes Mother   . Tuberculosis Mother   . Fibrocystic breast disease Mother   . Cancer Father     prostate  . Tuberculosis Father   . Alcohol abuse Father   . Gout Father   . Heart disease Sister   . Stroke Brother   . Heart disease Brother    History  Substance Use Topics  . Smoking status: Never Smoker   . Smokeless tobacco: Never Used  . Alcohol Use: No   OB History    No data available     Review of Systems  All other systems negative except as documented in the HPI. All pertinent positives and negatives as reviewed in the HPI.  Allergies  Contrast media;  Penicillins; and Sulfa antibiotics  Home Medications   Prior to Admission medications   Medication Sig Start Date End Date Taking? Authorizing Provider  Calcium Carbonate-Vitamin D (CALTRATE 600+D) 600-400 MG-UNIT per tablet Take 1 tablet by mouth 2 (two) times daily. 08/09/14  Yes Debbrah Alar, NP  fluticasone (FLONASE) 50 MCG/ACT nasal spray Place 2 sprays into both nostrils daily. 08/09/14  Yes Debbrah Alar, NP  sertraline (ZOLOFT) 100 MG tablet Take 1 tablet (100 mg total) by mouth daily. 08/09/14  Yes Debbrah Alar, NP  simvastatin (ZOCOR) 20 MG tablet Take 1 tablet (20 mg total) by mouth daily. 07/07/14  Yes Debbrah Alar, NP  Vitamin D, Ergocalciferol, (DRISDOL) 50000 UNITS CAPS capsule Take 1 capsule (50,000 Units total) by mouth every 7 (seven) days. Patient not taking: Reported on 11/15/2014 07/04/14   Debbrah Alar, NP   BP 103/56 mmHg  Pulse 62  Temp(Src) 98.1 F (36.7 C) (Oral)  Resp 14  Ht 5\' 2"  (1.575 m)  Wt 210 lb (95.255 kg)  BMI 38.40 kg/m2  SpO2 95% Physical Exam  Constitutional: She is oriented to person, place, and time. She appears well-developed and well-nourished. No distress.  HENT:  Head: Normocephalic  and atraumatic.  Eyes: Pupils are equal, round, and reactive to light.  Cardiovascular: Normal rate.   Pulmonary/Chest: Effort normal.  Musculoskeletal:       Left knee: She exhibits decreased range of motion. She exhibits no swelling and no ecchymosis. Tenderness found. Medial joint line tenderness noted.  Neurological: She is alert and oriented to person, place, and time. She exhibits normal muscle tone. Coordination normal.  Skin: Skin is warm and dry. No rash noted. No erythema.  Nursing note and vitals reviewed.   ED Course  Procedures (including critical care time) Labs Review Labs Reviewed - No data to display  Imaging Review Dg Knee Complete 4 Views Left  11/15/2014   CLINICAL DATA:  Fall at home while walking in the  kitchen, landing on the left knee. Anterior left knee pain. Initial encounter.  EXAM: LEFT KNEE - COMPLETE 4+ VIEW  COMPARISON:  None.  FINDINGS: No acute fracture, dislocation, or knee joint effusion is identified. A 3 mm well corticated ossicle adjacent to the head of the fibula may represent remote trauma. No lytic or blastic osseous lesion or soft tissue abnormality is identified.  IMPRESSION: No acute osseous abnormality identified.   Electronically Signed   By: Logan Bores   On: 11/15/2014 20:22     Patient be placed in a knee immobilizer with crutches.  She is advised to ice and elevate the knee told return here as needed.  I have given a follow-up with orthopedics.  Patient agrees the plan and all questions were answered.  She is advised that the x-ray results were negative    [name], PA-C 11/17/14 5400  Malvin Johns, MD 11/20/14 [phone]

## 2014-11-15 NOTE — Discharge Instructions (Signed)
Return here as needed.  Follow-up with the orthopedist provided.  Ice and elevate your knee °

## 2014-11-15 NOTE — ED Notes (Signed)
Bed: [facility] Expected date:  Expected time:  Means of arrival:  Comments: 

## 2014-11-15 NOTE — ED Notes (Signed)
Bed: RESA Expected date:  Expected time:  Means of arrival:  Comments: EMS/fall, knee pain/100 mcg Fentanyl

## 2014-11-19 ENCOUNTER — Other Ambulatory Visit: Payer: Self-pay | Admitting: Family

## 2014-12-08 ENCOUNTER — Other Ambulatory Visit: Payer: Self-pay | Admitting: Family

## 2014-12-08 NOTE — Telephone Encounter (Signed)
Rx sent to the pharmacy by e-script.//AB/CMA 

## 2015-02-07 ENCOUNTER — Ambulatory Visit (INDEPENDENT_AMBULATORY_CARE_PROVIDER_SITE_OTHER): Payer: BLUE CROSS/BLUE SHIELD | Admitting: Family

## 2015-02-07 VITALS — BP 104/82 | HR 78 | Temp 97.9°F | Resp 18 | Ht 62.5 in | Wt 216.8 lb

## 2015-02-07 DIAGNOSIS — F32A Depression, unspecified: Secondary | ICD-10-CM

## 2015-02-07 DIAGNOSIS — F329 Major depressive disorder, single episode, unspecified: Secondary | ICD-10-CM

## 2015-02-07 DIAGNOSIS — E785 Hyperlipidemia, unspecified: Secondary | ICD-10-CM | POA: Diagnosis not present

## 2015-02-07 LAB — LIPID PANEL
CHOLESTEROL: 275 mg/dL — AB (ref 0–200)
HDL: 43 mg/dL (ref >39.00)
LDL Cholesterol: 211 mg/dL — ABNORMAL HIGH (ref 0–99)
NonHDL: 231.99
Total CHOL/HDL Ratio: 6
Triglycerides: 104 mg/dL (ref 0.0–149.0)
VLDL: 20.8 mg/dL (ref 0.0–40.0)

## 2015-02-07 NOTE — Progress Notes (Signed)
Pre visit review using our clinic review tool, if applicable. No additional management support is needed unless otherwise documented below in the visit note. 

## 2015-02-08 ENCOUNTER — Encounter: Payer: Self-pay | Admitting: Family

## 2015-02-08 ENCOUNTER — Telehealth: Payer: Self-pay | Admitting: Family

## 2015-02-08 MED ORDER — SIMVASTATIN 20 MG PO TABS: 20.0000 mg | ORAL_TABLET | Freq: Every day | ORAL | Status: DC

## 2015-02-08 NOTE — Progress Notes (Signed)
Subjective:    Patient ID: Amanda Oconnor, female    DOB: 1955-07-08, 59 y.o.   MRN: [id number]  HPI  Amanda Oconnor is a 59 yr old female who presents today for follow up.  Hyperlipidemia- Pt stopped simvastatin several months ago.  Depression- pt is currently maitained on zoloft. She reports that her mood is good on current dose. Denies side effects.      Review of Systems See HPI  Past Medical History  Diagnosis Date  . History of chicken pox   . Depression   . Allergy   . Hyperlipidemia   . Vitamin D deficiency   . Fibrocystic breast disease   . Benign microscopic hematuria     per pt she has had extensive w/u which was negative    Social History   Social History  . Marital Status: Married    Spouse Name: N/A  . Number of Children: N/A  . Years of Education: N/A   Occupational History  . Not on file.   Social History Main Topics  . Smoking status: Never Smoker   . Smokeless tobacco: Never Used  . Alcohol Use: No  . Drug Use: No  . Sexual Activity: Not on file   Other Topics Concern  . Not on file   Social History Narrative   3 children   5 grandchildren   Married second marriage.  Husband is in [place].   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 Tonsillectomy      age 70  . Ablation  2009    Family History  Problem Relation Age of Onset  . Arthritis Mother   . Cancer Mother     lung  . Hyperlipidemia Mother   . Heart disease Mother   . Diabetes Mother   . Tuberculosis Mother   . Fibrocystic breast disease Mother   . Cancer Father     prostate  . Tuberculosis Father   . Alcohol abuse Father   . Gout Father   . Heart disease Sister   . Stroke Brother   . Heart disease Brother     Allergies  Allergen Reactions  . Contrast Media [Iodinated Diagnostic Agents] Shortness Of Breath  . Penicillins Hives   . Sulfa Antibiotics Rash    Current Outpatient Prescriptions on File Prior to Visit  Medication Sig Dispense Refill  . Calcium Carbonate-Vitamin D (CALTRATE 600+D) 600-400 MG-UNIT per tablet Take 1 tablet by mouth 2 (two) times daily. (Patient taking differently: Take 1 tablet by mouth daily. )    . sertraline (ZOLOFT) 100 MG tablet TAKE 1 TABLET (100 MG TOTAL) BY MOUTH DAILY. 30 tablet 2   No current facility-administered medications on file prior to visit.    BP 104/82 mmHg  Pulse 78  Temp(Src) 97.9 F (36.6 C) (Oral)  Resp 18  Ht 5' 2.5" (1.588 m)  Wt 216 lb 12.8 oz (98.34 kg)  BMI 39.00 kg/m2  SpO2 92%       Objective:   Physical Exam  Constitutional: She is oriented to person, place, and time. She appears well-developed and well-nourished.  HENT:  Head: Normocephalic and atraumatic.  Cardiovascular: Normal rate, regular rhythm and normal heart sounds.   No murmur heard. Pulmonary/Chest: Effort normal and breath sounds normal. No respiratory distress. She has no wheezes.  Neurological: She is alert and  oriented to person, place, and time.  Psychiatric: She has a normal mood and affect. Her behavior is normal. Judgment and thought content normal.          Assessment & Plan:  Advised pt to schedule mammogram.

## 2015-02-08 NOTE — Telephone Encounter (Signed)
Cholesterol has risen from 180 to 275.  Please resume simvastatin 20mg .

## 2015-02-08 NOTE — Assessment & Plan Note (Signed)
Stable on zoloft, continue same.  

## 2015-02-08 NOTE — Telephone Encounter (Signed)
Notified pt and she voices understanding. Rx sent to pharmacy.

## 2015-02-08 NOTE — Assessment & Plan Note (Signed)
Follow up lipid is uncontrolled. Resume statin (see phone note).

## 2015-05-14 ENCOUNTER — Telehealth: Payer: Self-pay | Admitting: Family

## 2015-05-14 MED ORDER — SERTRALINE HCL 100 MG PO TABS: ORAL_TABLET | ORAL | Status: DC

## 2015-05-14 NOTE — Telephone Encounter (Signed)
She should schedule OV to be seen due to fall. Refill sent for zoloft.

## 2015-05-14 NOTE — Telephone Encounter (Signed)
Pt declined appt. She said that she has aspirin and she will call back if she can come in before going out of town.

## 2015-05-14 NOTE — Telephone Encounter (Signed)
Pharmacy: [id number] - [place], [place]  Pt needs refills on sertraline. Pt leaving for out of town on Wednesday and gone for 2 weeks. She has 8 left.  Pt fell last week moving furniture. She fell on a stair on the corner. She said it has not stopped hurting. She has been taking tylenol taking 3 500mg  at a time every 3 hours. She is asking for advice. Ph# [phone].

## 2015-05-14 NOTE — Telephone Encounter (Signed)
Left message for pt to return my call.

## 2015-05-16 ENCOUNTER — Encounter: Payer: Self-pay | Admitting: Family

## 2015-05-16 ENCOUNTER — Ambulatory Visit (HOSPITAL_BASED_OUTPATIENT_CLINIC_OR_DEPARTMENT_OTHER)
Admission: RE | Admit: 2015-05-16 | Discharge: 2015-05-16 | Disposition: A | Discharge status: Home / Self Care | Payer: BLUE CROSS/BLUE SHIELD | Source: Ambulatory Visit | Attending: Family | Admitting: Family

## 2015-05-16 ENCOUNTER — Other Ambulatory Visit: Payer: Self-pay | Admitting: Family

## 2015-05-16 ENCOUNTER — Ambulatory Visit (INDEPENDENT_AMBULATORY_CARE_PROVIDER_SITE_OTHER): Payer: BLUE CROSS/BLUE SHIELD | Admitting: Family

## 2015-05-16 VITALS — BP 113/76 | HR 64 | Temp 98.1°F | Resp 18 | Ht 62.5 in | Wt 215.2 lb

## 2015-05-16 DIAGNOSIS — M533 Sacrococcygeal disorders, not elsewhere classified: Secondary | ICD-10-CM | POA: Diagnosis not present

## 2015-05-16 MED ORDER — HYDROCODONE-ACETAMINOPHEN 5-325 MG PO TABS: 1.0000 | ORAL_TABLET | Freq: Four times a day (QID) | ORAL | Status: DC | PRN

## 2015-05-16 NOTE — Progress Notes (Signed)
Pre visit review using our clinic review tool, if applicable. No additional management support is needed unless otherwise documented below in the visit note. 

## 2015-05-16 NOTE — Progress Notes (Signed)
Subjective:    Patient ID: Amanda Oconnor, female    DOB: 1956/01/25, 59 y.o.   MRN: [id number]  HPI  Ms. Bogdan is a 59 yr old female who presents today s/p fall 2 weeks ago.  She reports that she landed on her coccyx.  Pt continues to have pain in the tailbone area and reports that it hurts to sit and hurts to stand. Did buy a donut pillow which helps slightly with her pain while sitting.  No improvement with tylenol. Slight improvement with aspirin.  Fell backwards against the stairs while lifting a piece of furniture. Sacrum hit the point of the stair.   Review of Systems  See HPI  Past Medical History  Diagnosis Date  . History of chicken pox   . Depression   . Allergy   . Hyperlipidemia   . Vitamin D deficiency   . Fibrocystic breast disease   . Benign microscopic hematuria     per pt she has had extensive w/u which was negative    Social History   Social History  . Marital Status: Married    Spouse Name: N/A  . Number of Children: N/A  . Years of Education: N/A   Occupational History  . Not on file.   Social History Main Topics  . Smoking status: Never Smoker   . Smokeless tobacco: Never Used  . Alcohol Use: No  . Drug Use: No  . Sexual Activity: Not on file   Other Topics Concern  . Not on file   Social History Narrative   3 children   5 grandchildren   Married second marriage.  Husband is in [place].   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 Tonsillectomy      age 38  . Ablation  2009    Family History  Problem Relation Age of Onset  . Arthritis Mother   . Cancer Mother     lung  . Hyperlipidemia Mother   . Heart disease Mother   . Diabetes Mother   . Tuberculosis Mother   . Fibrocystic breast disease Mother   . Cancer Father     prostate  . Tuberculosis Father   . Alcohol abuse Father   . Gout  Father   . Heart disease Sister   . Stroke Brother   . Heart disease Brother     Allergies  Allergen Reactions  . Contrast Media [Iodinated Diagnostic Agents] Shortness Of Breath  . Penicillins Hives  . Sulfa Antibiotics Rash    Current Outpatient Prescriptions on File Prior to Visit  Medication Sig Dispense Refill  . Calcium Carbonate-Vitamin D (CALTRATE 600+D) 600-400 MG-UNIT per tablet Take 1 tablet by mouth 2 (two) times daily.    . sertraline (ZOLOFT) 100 MG tablet TAKE 1 TABLET (100 MG TOTAL) BY MOUTH DAILY. 30 tablet 3  . simvastatin (ZOCOR) 20 MG tablet Take 1 tablet (20 mg total) by mouth daily. 30 tablet 5   No current facility-administered medications on file prior to visit.    BP 113/76 mmHg  Pulse 64  Temp(Src) 98.1 F (36.7 C) (Oral)  Resp 18  Ht 5' 2.5" (1.588 m)  Wt 215 lb 3.2 oz (97.614 kg)  BMI 38.71 kg/m2  SpO2 97%       Objective:   Physical Exam  Constitutional: She appears well-developed and  well-nourished.  Cardiovascular: Normal rate, regular rhythm and normal heart sounds.   No murmur heard. Pulmonary/Chest: Effort normal and breath sounds normal. No respiratory distress. She has no wheezes.  Musculoskeletal:       Cervical back: She exhibits no tenderness.       Thoracic back: She exhibits no tenderness.  Lumbar/sacral area- tender to palpation  Psychiatric: She has a normal mood and affect. Her behavior is normal. Judgment and thought content normal.          Assessment & Plan:  Coccydynia- will obtain x ray of lumbar/sacral spine for further evaluation.  Rx provided for hydrocodone prn.

## 2015-05-16 NOTE — Patient Instructions (Signed)
You may add vicoden as needed for severe pain. If pain is less severe you can try motrin or aleve first. Complete x ray on the first floor. Call if symptoms worsen or do not improve in 1-2 weeks.

## 2015-06-01 ENCOUNTER — Ambulatory Visit (HOSPITAL_BASED_OUTPATIENT_CLINIC_OR_DEPARTMENT_OTHER)
Admission: RE | Admit: 2015-06-01 | Discharge: 2015-06-01 | Disposition: A | Discharge status: Home / Self Care | Payer: BLUE CROSS/BLUE SHIELD | Source: Ambulatory Visit | Attending: Family | Admitting: Family

## 2015-06-01 ENCOUNTER — Encounter: Payer: Self-pay | Admitting: Family

## 2015-06-01 DIAGNOSIS — Z13820 Encounter for screening for osteoporosis: Secondary | ICD-10-CM | POA: Diagnosis not present

## 2015-06-01 DIAGNOSIS — M8580 Other specified disorders of bone density and structure, unspecified site: Secondary | ICD-10-CM | POA: Insufficient documentation

## 2015-06-01 DIAGNOSIS — Z0000 Encounter for general adult medical examination without abnormal findings: Secondary | ICD-10-CM

## 2015-06-01 DIAGNOSIS — R928 Other abnormal and inconclusive findings on diagnostic imaging of breast: Secondary | ICD-10-CM | POA: Diagnosis not present

## 2015-06-01 DIAGNOSIS — Z780 Asymptomatic menopausal state: Secondary | ICD-10-CM | POA: Insufficient documentation

## 2015-06-01 DIAGNOSIS — Z1231 Encounter for screening mammogram for malignant neoplasm of breast: Secondary | ICD-10-CM | POA: Diagnosis not present

## 2015-06-04 ENCOUNTER — Telehealth: Payer: Self-pay | Admitting: *Deleted

## 2015-06-04 NOTE — Telephone Encounter (Signed)
Completed as much as possible and forwarded to [facility]. JG//CMA

## 2015-06-05 ENCOUNTER — Other Ambulatory Visit: Payer: Self-pay | Admitting: Family

## 2015-06-05 ENCOUNTER — Telehealth: Payer: Self-pay | Admitting: Family

## 2015-06-05 DIAGNOSIS — R928 Other abnormal and inconclusive findings on diagnostic imaging of breast: Secondary | ICD-10-CM

## 2015-06-05 NOTE — Telephone Encounter (Signed)
Pt is requesting her results from Friday's visit. She also asked the status of her paperwork. Advised pt of status of paper work from message below.   Please advise pt of results if they are in, thanks  CB: [phone]

## 2015-06-05 NOTE — Telephone Encounter (Signed)
Melissa-- it looks like pt has osteopenia in the hip and normal spine.  Any recommendations?

## 2015-06-06 NOTE — Telephone Encounter (Signed)
Paperwork is complete. dexa shows osteopenia, not severe enough to start fosamax. Advise pt to continuecaltrate 600mg  + D one tablet by mouth twice daily. In addition, please ensure regular weight bearing exercise such as walking.

## 2015-06-06 NOTE — Telephone Encounter (Signed)
Correction to below documentation, form not faxed yet. Form needs glucose and waist circumference for completion.  Left message for pt to return my call. Will need nurse visit for POCT glucose and waist measurement.

## 2015-06-06 NOTE — Telephone Encounter (Signed)
Notified pt of below and she voices understanding. Wellness form faxed to [phone]. Pt also asked about mammogram result. Pt notified of need for additional imaging and given #s to call to schedule.

## 2015-06-08 NOTE — Telephone Encounter (Signed)
Left message for pt to return my call.

## 2015-06-11 NOTE — Telephone Encounter (Signed)
Notified pt. She will come in at 7am tomorrow to complete glucose and waist measurement.

## 2015-06-12 ENCOUNTER — Encounter: Payer: Self-pay | Admitting: Family

## 2015-06-12 ENCOUNTER — Ambulatory Visit
Admission: RE | Admit: 2015-06-12 | Discharge: 2015-06-12 | Disposition: A | Discharge status: Home / Self Care | Payer: BLUE CROSS/BLUE SHIELD | Source: Ambulatory Visit | Attending: Family | Admitting: Family

## 2015-06-12 ENCOUNTER — Ambulatory Visit (INDEPENDENT_AMBULATORY_CARE_PROVIDER_SITE_OTHER): Payer: BLUE CROSS/BLUE SHIELD | Admitting: Family

## 2015-06-12 DIAGNOSIS — R928 Other abnormal and inconclusive findings on diagnostic imaging of breast: Secondary | ICD-10-CM

## 2015-06-12 DIAGNOSIS — R739 Hyperglycemia, unspecified: Secondary | ICD-10-CM | POA: Insufficient documentation

## 2015-06-12 LAB — HEMOGLOBIN A1C: HEMOGLOBIN A1C: 6.2 % (ref 4.6–6.5)

## 2015-06-12 NOTE — Progress Notes (Signed)
Pt in the office for fingerstick glucose and waist measurement to complete wellness form. Fasting glucose this am is 139. Per verbal from PCP, order hgb a1c. Pt sent to lab. Form faxed to  [phone].

## 2015-06-13 NOTE — Telephone Encounter (Signed)
Form was completed on 06/12/15 and faxed to below #.

## 2015-06-15 ENCOUNTER — Encounter: Payer: Self-pay | Admitting: *Deleted

## 2015-07-27 ENCOUNTER — Telehealth: Payer: Self-pay | Admitting: Family

## 2015-07-27 NOTE — Telephone Encounter (Signed)
Pt states she does not want to take the flu vaccine. She will call after looking at her work schedule to set up a cpe (due now)

## 2015-07-27 NOTE — Telephone Encounter (Signed)
Health maintenance updated

## 2015-08-06 ENCOUNTER — Ambulatory Visit (INDEPENDENT_AMBULATORY_CARE_PROVIDER_SITE_OTHER): Payer: BLUE CROSS/BLUE SHIELD | Admitting: Family

## 2015-08-06 ENCOUNTER — Encounter: Payer: Self-pay | Admitting: Family

## 2015-08-06 VITALS — BP 114/69 | HR 71 | Temp 98.2°F | Resp 16 | Ht 62.5 in | Wt 211.0 lb

## 2015-08-06 DIAGNOSIS — J329 Chronic sinusitis, unspecified: Secondary | ICD-10-CM | POA: Insufficient documentation

## 2015-08-06 DIAGNOSIS — J019 Acute sinusitis, unspecified: Secondary | ICD-10-CM

## 2015-08-06 MED ORDER — LEVOFLOXACIN 500 MG PO TABS
500.0000 mg | ORAL_TABLET | Freq: Every day | ORAL | Status: DC
Start: 2015-08-06 — End: 2015-10-23

## 2015-08-06 MED ORDER — SIMVASTATIN 20 MG PO TABS: 20.0000 mg | ORAL_TABLET | Freq: Every day | ORAL | Status: DC

## 2015-08-06 NOTE — Progress Notes (Signed)
Pre visit review using our clinic review tool, if applicable. No additional management support is needed unless otherwise documented below in the visit note. 

## 2015-08-06 NOTE — Patient Instructions (Signed)
Start levaquin for your sinus infection. Use nasal saline twice daily. You may add flonase 2 sprays each nostril once daily. Call if symptoms worsen, if you develop fever >101 or if symptoms are not improved in 3 days.

## 2015-08-06 NOTE — Progress Notes (Signed)
Subjective:    Patient ID: Amanda Oconnor, female    DOB: [birth year], 60 y.o.   MRN: [id number]  HPI  Amanda Oconnor is a 60 yr old female who presents today with chief complaint of cough. She reports that symptoms began >1 week ago and include nasal congestion. Cough is productive of green sputum.  Has been using Vicks/Dayquil without improvement in her symptoms. Reports subjective temp.    Review of Systems    see HPI  Past Medical History  Diagnosis Date  . History of chicken pox   . Depression   . Allergy   . Hyperlipidemia   . Vitamin D deficiency   . Fibrocystic breast disease   . Benign microscopic hematuria     per pt she has had extensive w/u which was negative    Social History   Social History  . Marital Status: Married    Spouse Name: N/A  . Number of Children: N/A  . Years of Education: N/A   Occupational History  . Not on file.   Social History Main Topics  . Smoking status: Never Smoker   . Smokeless tobacco: Never Used  . Alcohol Use: No  . Drug Use: No  . Sexual Activity: Not on file   Other Topics Concern  . Not on file   Social History Narrative   3 children   5 grandchildren   Married second marriage.  Husband is in [place].   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 Tonsillectomy      age 39  . Ablation  2009    Family History  Problem Relation Age of Onset  . Arthritis Mother   . Cancer Mother     lung  . Hyperlipidemia Mother   . Heart disease Mother   . Diabetes Mother   . Tuberculosis Mother   . Fibrocystic breast disease Mother   . Cancer Father     prostate  . Tuberculosis Father   . Alcohol abuse Father   . Gout Father   . Heart disease Sister   . Stroke Brother   . Heart disease Brother     Allergies  Allergen Reactions  . Contrast Media [Iodinated Diagnostic Agents]  Shortness Of Breath  . Penicillins Hives  . Sulfa Antibiotics Rash    Current Outpatient Prescriptions on File Prior to Visit  Medication Sig Dispense Refill  . Calcium Carbonate-Vitamin D (CALTRATE 600+D) 600-400 MG-UNIT per tablet Take 1 tablet by mouth 2 (two) times daily.    . sertraline (ZOLOFT) 100 MG tablet TAKE 1 TABLET (100 MG TOTAL) BY MOUTH DAILY. 30 tablet 3  . simvastatin (ZOCOR) 20 MG tablet Take 1 tablet (20 mg total) by mouth daily. (Patient not taking: Reported on 08/06/2015) 30 tablet 5   No current facility-administered medications on file prior to visit.    BP 114/69 mmHg  Pulse 71  Temp(Src) 98.2 F (36.8 C) (Oral)  Resp 16  Ht 5' 2.5" (1.588 m)  Wt 211 lb (95.709 kg)  BMI 37.95 kg/m2  SpO2 98%    Objective:   Physical Exam  Constitutional: She is oriented to person, place, and time. She appears well-developed and well-nourished.  HENT:  Mild maxillary/frontal sinus tenderness to palpation  Cardiovascular: Normal rate, regular rhythm and normal heart sounds.   No murmur heard. Pulmonary/Chest: Effort normal  and breath sounds normal. No respiratory distress. She has no wheezes.  Musculoskeletal: She exhibits no edema.  Lymphadenopathy:    She has no cervical adenopathy.  Neurological: She is alert and oriented to person, place, and time.  Psychiatric: She has a normal mood and affect. Her behavior is normal. Judgment and thought content normal.          Assessment & Plan:  Sinusitis- will rx with levaquin due to penicillin and sulfa allergy. Pt advised as follows:    Start levaquin for your sinus infection. Use nasal saline twice daily. You may add flonase 2 sprays each nostril once daily. Call if symptoms worsen, if you develop fever >101 or if symptoms are not improved in 3 days.

## 2015-09-10 ENCOUNTER — Other Ambulatory Visit: Payer: Self-pay | Admitting: Family

## 2015-09-11 NOTE — Telephone Encounter (Signed)
Medication filled to pharmacy as requested.   

## 2015-10-23 ENCOUNTER — Encounter: Payer: Self-pay | Admitting: Family

## 2015-10-23 ENCOUNTER — Ambulatory Visit (INDEPENDENT_AMBULATORY_CARE_PROVIDER_SITE_OTHER): Payer: BLUE CROSS/BLUE SHIELD | Admitting: Family

## 2015-10-23 VITALS — BP 112/63 | HR 61 | Temp 98.2°F | Resp 16 | Ht 63.0 in | Wt 214.2 lb

## 2015-10-23 DIAGNOSIS — Z0000 Encounter for general adult medical examination without abnormal findings: Secondary | ICD-10-CM | POA: Diagnosis not present

## 2015-10-23 LAB — CBC WITH DIFFERENTIAL/PLATELET
Basophils Absolute: 0 10*3/uL (ref 0.0–0.1)
Basophils Relative: 0.3 % (ref 0.0–3.0)
Eosinophils Absolute: 0.1 10*3/uL (ref 0.0–0.7)
Eosinophils Relative: 1.6 % (ref 0.0–5.0)
HCT: 43.3 % (ref 36.0–46.0)
Hemoglobin: 14.7 g/dL (ref 12.0–15.0)
LYMPHS ABS: 2.6 10*3/uL (ref 0.7–4.0)
Lymphocytes Relative: 33 % (ref 12.0–46.0)
MCHC: 33.9 g/dL (ref 30.0–36.0)
MCV: 88.1 fl (ref 78.0–100.0)
MONOS PCT: 7.2 % (ref 3.0–12.0)
Monocytes Absolute: 0.6 10*3/uL (ref 0.1–1.0)
NEUTROS ABS: 4.6 10*3/uL (ref 1.4–7.7)
NEUTROS PCT: 57.9 % (ref 43.0–77.0)
Platelets: 291 10*3/uL (ref 150.0–400.0)
RBC: 4.92 Mil/uL (ref 3.87–5.11)
RDW: 13 % (ref 11.5–15.5)
WBC: 8 10*3/uL (ref 4.0–10.5)

## 2015-10-23 LAB — LIPID PANEL
Cholesterol: 238 mg/dL — ABNORMAL HIGH (ref 0–200)
HDL: 47.8 mg/dL (ref >39.00)
LDL Cholesterol: 168 mg/dL — ABNORMAL HIGH (ref 0–99)
NonHDL: 190.07
Total CHOL/HDL Ratio: 5
Triglycerides: 112 mg/dL (ref 0.0–149.0)
VLDL: 22.4 mg/dL (ref 0.0–40.0)

## 2015-10-23 LAB — URINALYSIS, ROUTINE W REFLEX MICROSCOPIC
BILIRUBIN URINE: NEGATIVE
Ketones, ur: NEGATIVE
LEUKOCYTES UA: NEGATIVE
NITRITE: NEGATIVE
Specific Gravity, Urine: 1.01 (ref 1.000–1.030)
Total Protein, Urine: NEGATIVE
URINE GLUCOSE: NEGATIVE
UROBILINOGEN UA: 0.2 (ref 0.0–1.0)
WBC, UA: NONE SEEN (ref 0–?)
pH: 6 (ref 5.0–8.0)

## 2015-10-23 LAB — BASIC METABOLIC PANEL
BUN: 15 mg/dL (ref 6–23)
CO2: 30 mEq/L (ref 19–32)
Calcium: 9.6 mg/dL (ref 8.4–10.5)
Chloride: 102 mEq/L (ref 96–112)
Creatinine, Ser: 0.76 mg/dL (ref 0.40–1.20)
GFR: 82.54 mL/min (ref >60.00)
Glucose, Bld: 115 mg/dL — ABNORMAL HIGH (ref 70–99)
POTASSIUM: 4.2 meq/L (ref 3.5–5.1)
SODIUM: 138 meq/L (ref 135–145)

## 2015-10-23 LAB — HEPATIC FUNCTION PANEL
ALBUMIN: 4.4 g/dL (ref 3.5–5.2)
ALT: 31 U/L (ref 0–35)
AST: 21 U/L (ref 0–37)
Alkaline Phosphatase: 56 U/L (ref 39–117)
Bilirubin, Direct: 0.1 mg/dL (ref 0.0–0.3)
TOTAL PROTEIN: 7.4 g/dL (ref 6.0–8.3)
Total Bilirubin: 0.5 mg/dL (ref 0.2–1.2)

## 2015-10-23 LAB — TSH: TSH: 4.32 u[IU]/mL (ref 0.35–4.50)

## 2015-10-23 NOTE — Progress Notes (Signed)
Pre visit review using our clinic review tool, if applicable. No additional management support is needed unless otherwise documented below in the visit note. 

## 2015-10-23 NOTE — Patient Instructions (Signed)
Please complete lab work prior to leaving. Continue to work on [employer], exercise, weight loss.  You will need a tetanus in December- you can schedule a nurse visit to get this. You will be contacted about your referral to GI and to the GYN.

## 2015-10-23 NOTE — Progress Notes (Signed)
Subjective:    Patient ID: Amanda Oconnor, female    DOB: [birth year], 60 y.o.   MRN: [id number]  HPI  Amanda Oconnor is a 60 yr old female who presents today for cpx.  Immunizations: 2008 Diet: reports fair diet. Has cut out potatoes/white bread/ watching portions, eliminated alcohol, soda/juice  Exercise: reports that she walks every day 15-20 minutes, 5000 steps a day.  Walks stairs at work Colonoscopy: due  Dexa: 12/16 Pap Smear: Hx of abnormal pap Mammogram:12/16 Dental: up to date Vision: up to date  Wt Readings from Last 3 Encounters:  10/23/15 214 lb 3.2 oz (97.16 kg)  08/06/15 211 lb (95.709 kg)  05/16/15 215 lb 3.2 oz (97.614 kg)    Review of Systems  Constitutional: Negative for unexpected weight change.  HENT: Negative for rhinorrhea.        Reports some concerns about hearing in her left  Eyes: Negative for visual disturbance.  Respiratory: Negative for cough.   Cardiovascular: Negative for leg swelling.  Gastrointestinal: Negative for diarrhea and constipation.  Genitourinary: Negative for dysuria and frequency.  Musculoskeletal: Negative for myalgias and arthralgias.  Skin: Negative for rash.       Multiple moles  Neurological: Negative for headaches.  Hematological: Negative for adenopathy.  Psychiatric/Behavioral:       Stress with family       Past Medical History  Diagnosis Date  . History of chicken pox   . Depression   . Allergy   . Hyperlipidemia   . Vitamin D deficiency   . Fibrocystic breast disease   . Benign microscopic hematuria     per pt she has had extensive w/u which was negative     Social History   Social History  . Marital Status: Married    Spouse Name: N/A  . Number of Children: N/A  . Years of Education: N/A   Occupational History  . Not on file.   Social History Main Topics  . Smoking status: Never Smoker   . Smokeless tobacco: Never Used  . Alcohol Use: No  . Drug Use: No  . Sexual Activity: Not on file   Other  Topics Concern  . Not on file   Social History Narrative   3 children   5 grandchildren   Married second marriage.  Husband is in [place].   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 Tonsillectomy      age 69  . Ablation  2009  . Tooth extraction  09/22/14    pt reported    Family History  Problem Relation Age of Onset  . Arthritis Mother   . Cancer Mother     lung  . Hyperlipidemia Mother   . Heart disease Mother   . Diabetes Mother   . Tuberculosis Mother   . Fibrocystic breast disease Mother   . Cancer Father     prostate  . Tuberculosis Father   . Alcohol abuse Father   . Gout Father   . Heart disease Sister   . Stroke Brother   . Heart disease Brother     Allergies  Allergen Reactions  . Contrast Media [Iodinated Diagnostic Agents] Shortness Of Breath  . Penicillins Hives  . Sulfa Antibiotics Rash    Current Outpatient Prescriptions on File Prior to Visit  Medication Sig Dispense Refill  .  sertraline (ZOLOFT) 100 MG tablet TAKE 1 TABLET (100 MG TOTAL) BY MOUTH DAILY. 30 tablet 3  . simvastatin (ZOCOR) 20 MG tablet Take 1 tablet (20 mg total) by mouth daily. 30 tablet 5   No current facility-administered medications on file prior to visit.    BP 112/63 mmHg  Pulse 61  Temp(Src) 98.2 F (36.8 C) (Oral)  Resp 16  Ht 5\' 3"  (1.6 m)  Wt 214 lb 3.2 oz (97.16 kg)  BMI 37.95 kg/m2  SpO2 100%    Objective:   Physical Exam  Physical Exam  Constitutional: She is oriented to person, place, and time. She appears well-developed and well-nourished. No distress.  HENT:  Head: Normocephalic and atraumatic.  Right Ear: Tympanic membrane and ear canal normal.  Left Ear: Tympanic membrane and ear canal normal.  Mouth/Throat: Oropharynx is clear and moist.  Eyes: Pupils are equal, round, and reactive to light. No scleral  icterus.  Neck: Normal range of motion. No thyromegaly present.  Cardiovascular: Normal rate and regular rhythm.   No murmur heard. Pulmonary/Chest: Effort normal and breath sounds normal. No respiratory distress. He has no wheezes. She has no rales. She exhibits no tenderness.  Abdominal: Soft. Bowel sounds are normal. He exhibits no distension and no mass. There is no tenderness. There is no rebound and no guarding.  Musculoskeletal: She exhibits no edema.  Lymphadenopathy:    She has no cervical adenopathy.  Neurological: She is alert and oriented to person, place, and time. She has normal reflexes. She exhibits normal muscle tone. Coordination normal.  Skin: Skin is warm and dry. multiple small cherry angiomas noted on abdomen, anterior thighs.  Psychiatric: She has a normal mood and affect. Her behavior is normal. Judgment and thought content normal.  Breasts: Examined lying Right: Without masses, retractions, discharge or axillary adenopathy.  Left: Without masses, retractions, discharge or axillary adenopathy.           Assessment & Plan:           Assessment & Plan:  Preventative- discussed healthy diet, exercise, colonoscopy. Refer to GYN for Pap. Obtain routine lab work.  EKG personally reviewed and is compared to EKG from 2016- appears unchanged. Sinus brady noted.

## 2015-10-29 ENCOUNTER — Other Ambulatory Visit: Payer: Self-pay | Admitting: Family

## 2015-10-29 ENCOUNTER — Telehealth: Payer: Self-pay | Admitting: Family

## 2015-10-29 ENCOUNTER — Other Ambulatory Visit (INDEPENDENT_AMBULATORY_CARE_PROVIDER_SITE_OTHER): Payer: BLUE CROSS/BLUE SHIELD

## 2015-10-29 ENCOUNTER — Encounter: Payer: Self-pay | Admitting: Family

## 2015-10-29 DIAGNOSIS — B20 Human immunodeficiency virus [HIV] disease: Secondary | ICD-10-CM

## 2015-10-29 DIAGNOSIS — E785 Hyperlipidemia, unspecified: Secondary | ICD-10-CM

## 2015-10-29 DIAGNOSIS — R739 Hyperglycemia, unspecified: Secondary | ICD-10-CM

## 2015-10-29 LAB — HEMOGLOBIN A1C: Hgb A1c MFr Bld: 6.2 % (ref 4.6–6.5)

## 2015-10-29 MED ORDER — SIMVASTATIN 40 MG PO TABS: 40.0000 mg | ORAL_TABLET | Freq: Every day | ORAL | Status: DC

## 2015-10-29 NOTE — Telephone Encounter (Signed)
Cholesterol is uncontrolled.  Advise pt to increase simvastatin from 20mg  to 40 mg.  Repeat flp in 6 weeks. Sugar is mildly elevated but not in diabetes range. Please work on avoiding concentrated sweets, and limiting white carbs (rice/bread/pasta/potatoes). Instead substitute whole grain versions with reasonable portions. Can we add on A1C to her labs please? I would like to see her back in the office in 6 months please.

## 2015-10-29 NOTE — Telephone Encounter (Signed)
Add on form faxed to the lab. Notified pt of below results and she voices understanding. F/u scheduled for 04/30/16 at 7am. Forgot to schedule pt lab appt in 6 weeks. Has now been scheduled for 12/10/15 at 7am (labs only).  Scheduled appt and mailed information to pt.

## 2015-11-26 ENCOUNTER — Encounter: Payer: Self-pay | Admitting: Obstetrics & Gynecology

## 2015-11-26 DIAGNOSIS — Z01419 Encounter for gynecological examination (general) (routine) without abnormal findings: Secondary | ICD-10-CM

## 2015-12-10 ENCOUNTER — Other Ambulatory Visit: Payer: BLUE CROSS/BLUE SHIELD

## 2016-01-16 ENCOUNTER — Other Ambulatory Visit: Payer: Self-pay | Admitting: Family

## 2016-02-07 ENCOUNTER — Telehealth: Payer: Self-pay

## 2016-02-07 NOTE — Telephone Encounter (Signed)
Form brought in by patient.  Form completed and signed by provider.  Copy sent for scanning.  Completed form placed up front for pick up.

## 2016-02-07 NOTE — Telephone Encounter (Signed)
Pt.notified

## 2016-02-11 ENCOUNTER — Encounter: Payer: Self-pay | Admitting: Obstetrics & Gynecology

## 2016-02-11 ENCOUNTER — Ambulatory Visit (INDEPENDENT_AMBULATORY_CARE_PROVIDER_SITE_OTHER): Payer: BLUE CROSS/BLUE SHIELD | Admitting: Obstetrics & Gynecology

## 2016-02-11 VITALS — BP 118/80 | HR 98 | Ht 62.5 in | Wt 217.0 lb

## 2016-02-11 DIAGNOSIS — Z01419 Encounter for gynecological examination (general) (routine) without abnormal findings: Secondary | ICD-10-CM | POA: Diagnosis not present

## 2016-02-11 DIAGNOSIS — N941 Unspecified dyspareunia: Secondary | ICD-10-CM

## 2016-02-11 DIAGNOSIS — R350 Frequency of micturition: Secondary | ICD-10-CM

## 2016-02-11 DIAGNOSIS — N952 Postmenopausal atrophic vaginitis: Secondary | ICD-10-CM

## 2016-02-11 DIAGNOSIS — Z124 Encounter for screening for malignant neoplasm of cervix: Secondary | ICD-10-CM | POA: Diagnosis not present

## 2016-02-11 DIAGNOSIS — N951 Menopausal and female climacteric states: Secondary | ICD-10-CM

## 2016-02-11 MED ORDER — MEDROXYPROGESTERONE ACETATE 2.5 MG PO TABS: 2.5000 mg | ORAL_TABLET | Freq: Every day | ORAL | 3 refills | Status: DC

## 2016-02-11 MED ORDER — ESTRADIOL 0.1 MG/GM VA CREA: TOPICAL_CREAM | VAGINAL | 12 refills | Status: DC

## 2016-02-11 MED ORDER — ESTRADIOL 0.5 MG PO TABS: 1.0000 mg | ORAL_TABLET | Freq: Every day | ORAL | 3 refills | Status: DC

## 2016-02-11 NOTE — Patient Instructions (Signed)
Lubricants  - Water or silicone-based  - No perfumes; avoid glycerin or parabens  - If water based look for information on osmolality  - Lubricate all surfaces as a part of foreplay  - Keep lubricant handy in case more is needed  - Sneaking into bathroom before sex is not a good way to use lubricants  Name of Product Description Perfume- Free Paraben-Free Glycerin-Free PH-balanced Cost per month  Hyalo-GYN Gel in Tampon applicator containing Hydeal-D, a natural source of moisture. Http://www.hyalogyn.com Yes No Yes No $25  Replens Gel in tampon applicator, Clings to vaginal lining to keep it moist Yes Yes No Yes $15-32  K-Y Liquibeads Suppository that melts in the vagina Yes Yes No No $18-36  Neogyn Cream Cream to soothe vulvar dryness and pain, contains cutaneous lysate, a healing ingredient; not internal moisturizer but may help with irritation on the vulvar  http://www.neogyncream.com/ Yes Yes No No $39     Vaginal Moisturizers  

## 2016-02-11 NOTE — Progress Notes (Signed)
Subjective:     Amanda Oconnor is a 60 y.o. female here for a routine exam.  Current complaints: pt c/o vaginal dryness.  Pt has stopped having intercourse due to vaginal dryness.  Pt was last sexually active 6 month prev.   h/o hematuria but, workup was neg.  Pt c/o hot flushes and urinary freq.   Gynecologic History No LMP recorded. Patient has had an ablation. Contraception: post menopausal status Last Pap: 4 years prev. Results were: normal.   Last mammogram: 05/2015. Results were: birad 2- benign  Obstetric History OB History  Gravida Para Term Preterm AB Living  4 3 3         SAB TAB Ectopic Multiple Live Births          3    # Outcome Date GA Lbr Len/2nd Weight Sex Delivery Anes PTL Lv  4 Gravida           3 Term      Vag-Spont     2 Term      Vag-Spont     1 Term              Patient Active Problem List   Diagnosis Date Noted  . Sinusitis 08/06/2015  . Hyperglycemia 06/12/2015  . Osteopenia 06/01/2015  . Heme positive stool 08/25/2014  . Preventative health care 08/09/2014  . Recurrent sinus infections 08/09/2014  . Hypoglycemia 06/26/2014  . Depression 11/21/2013  . Benign microscopic hematuria 11/21/2013  . Hyperlipidemia 11/21/2013  . Vitamin D deficiency 11/21/2013  . Weight gain 11/21/2013   Current Outpatient Prescriptions on File Prior to Visit  Medication Sig Dispense Refill  . Calcium Carbonate (CALTRATE 600 PO) Take 1 tablet by mouth 2 (two) times daily.    . Cholecalciferol (VITAMIN D PO) Take 500 Units by mouth daily.    . sertraline (ZOLOFT) 100 MG tablet TAKE 1 TABLET BY MOUTH EVERY DAY 30 tablet 5  . simvastatin (ZOCOR) 40 MG tablet Take 1 tablet (40 mg total) by mouth at bedtime. 90 tablet 0   No current facility-administered medications on file prior to visit.    The following portions of the patient's history were reviewed and updated as appropriate: allergies, current medications, past family history, past medical history, past social history,  past surgical history and problem list.  Review of Systems Pertinent items are noted in HPI.    Objective:  Ht 5' 2.5" (1.588 m)   Wt 217 lb (98.4 kg)   BMI 39.06 kg/m   General Appearance:    Alert, cooperative, no distress, appears stated age  Head:    Normocephalic, without obvious abnormality, atraumatic  Eyes:    conjunctiva/corneas clear, EOM's intact, both eyes  Ears:    Normal external ear canals, both ears  Nose:   Nares normal, septum midline, mucosa normal, no drainage    or sinus tenderness  Throat:   Lips, mucosa, and tongue normal; teeth and gums normal  Neck:   Supple, symmetrical, trachea midline, no adenopathy;    thyroid:  no enlargement/tenderness/nodules  Back:     Symmetric, no curvature, ROM normal, no CVA tenderness  Lungs:     Clear to auscultation bilaterally, respirations unlabored  Chest Wall:    No tenderness or deformity   Heart:    Regular rate and rhythm, S1 and S2 normal, no murmur, rub   or gallop  Breast Exam:    No tenderness, masses, or nipple abnormality  Abdomen:     Soft, non-tender,  bowel sounds active all four quadrants,    no masses, no organomegaly  Genitalia:    Normal female without lesion, discharge or tenderness; vaginal atrophy     Extremities:   Extremities normal, atraumatic, no cyanosis or edema  Pulses:   2+ and symmetric all extremities  Skin:   Skin color, texture, turgor normal, no rashes or lesions     Assessment:    Healthy female exam.   Genitourinary syndrome of menopause  General Appearance:    Alert, cooperative, no distress, appears stated age  Head:    Normocephalic, without obvious abnormality, atraumatic  Eyes:    conjunctiva/corneas clear, EOM's intact, both eyes  Ears:    Normal external ear canals, both ears  Nose:   Nares normal, septum midline, mucosa normal, no drainage    or sinus tenderness  Throat:   Lips, mucosa, and tongue normal; teeth and gums normal  Neck:   Supple, symmetrical, trachea  midline, no adenopathy;    thyroid:  no enlargement/tenderness/nodules  Back:     Symmetric, no curvature, ROM normal, no CVA tenderness  Lungs:     Clear to auscultation bilaterally, respirations unlabored  Chest Wall:    No tenderness or deformity   Heart:    Regular rate and rhythm, S1 and S2 normal, no murmur, rub   or gallop  Breast Exam:    No tenderness, masses, or nipple abnormality  Abdomen:     Soft, non-tender, bowel sounds active all four quadrants,    no masses, no organomegaly  Genitalia:    Normal female without lesion, discharge or tenderness     Extremities:   Extremities normal, atraumatic, no cyanosis or edema  Pulses:   2+ and symmetric all extremities  Skin:   Skin color, texture, turgor normal, no rashes or lesions     Plan:  Annual GYN with PAP Genitourinary syndrome of menopause Menopausal state with sx Right ear tingling and pressure  Patient with bothersome menopausal vasomotor symptoms. Discussed lifestyle interventions such as wearing light clothing, remaining in cool environments, having fan/air conditioner in the room, avoiding hot beverages etc.  Discussed using hormone therapy and concerns about increased risk of heart disease, cerebrovascular disease, thromboembolic disease,  and breast cancer.  Also discussed other medical options such as Paxil, Effexor or Neurontin.   Also discussed alternative therapies such as herbal remedies but cautioned that most of the products contained phytoestrogens (plant estrogens) in unregulated amounts which can have the same effects on the body as the pharmaceutical estrogen preparations.  Also referred her to www.[email or web address].org for other alternative options.  Patient opted for oral estrogen therapy for now.   Estrace vaginal cream q HS for 1 month Estrace tab 1.0mg  po daily Provera 2.5mg  q day on days 1-5 of the months F/u in 3 months or sooner prn Vaginal lubricants with intercourse. Exercise with weight lifting for  weight loss Sudafed q 6 hours for ear pressure F/u PAP  Rajiv Parlato L. Harraway-Smith, M.D., Cherlynn June

## 2016-02-11 NOTE — Addendum Note (Signed)
Addended by: Phill Myron on: 02/11/2016 10:35 AM   Modules accepted: Orders

## 2016-02-13 LAB — CYTOLOGY - PAP

## 2016-02-20 IMAGING — DX DG SACRUM/COCCYX 2+V
3 series · 3 of 3 positions shown · non-contrast
Comparison: None.

CLINICAL DATA: Fall.

EXAM:
SACRUM AND COCCYX - 2+ VIEW

[coccyx ap]
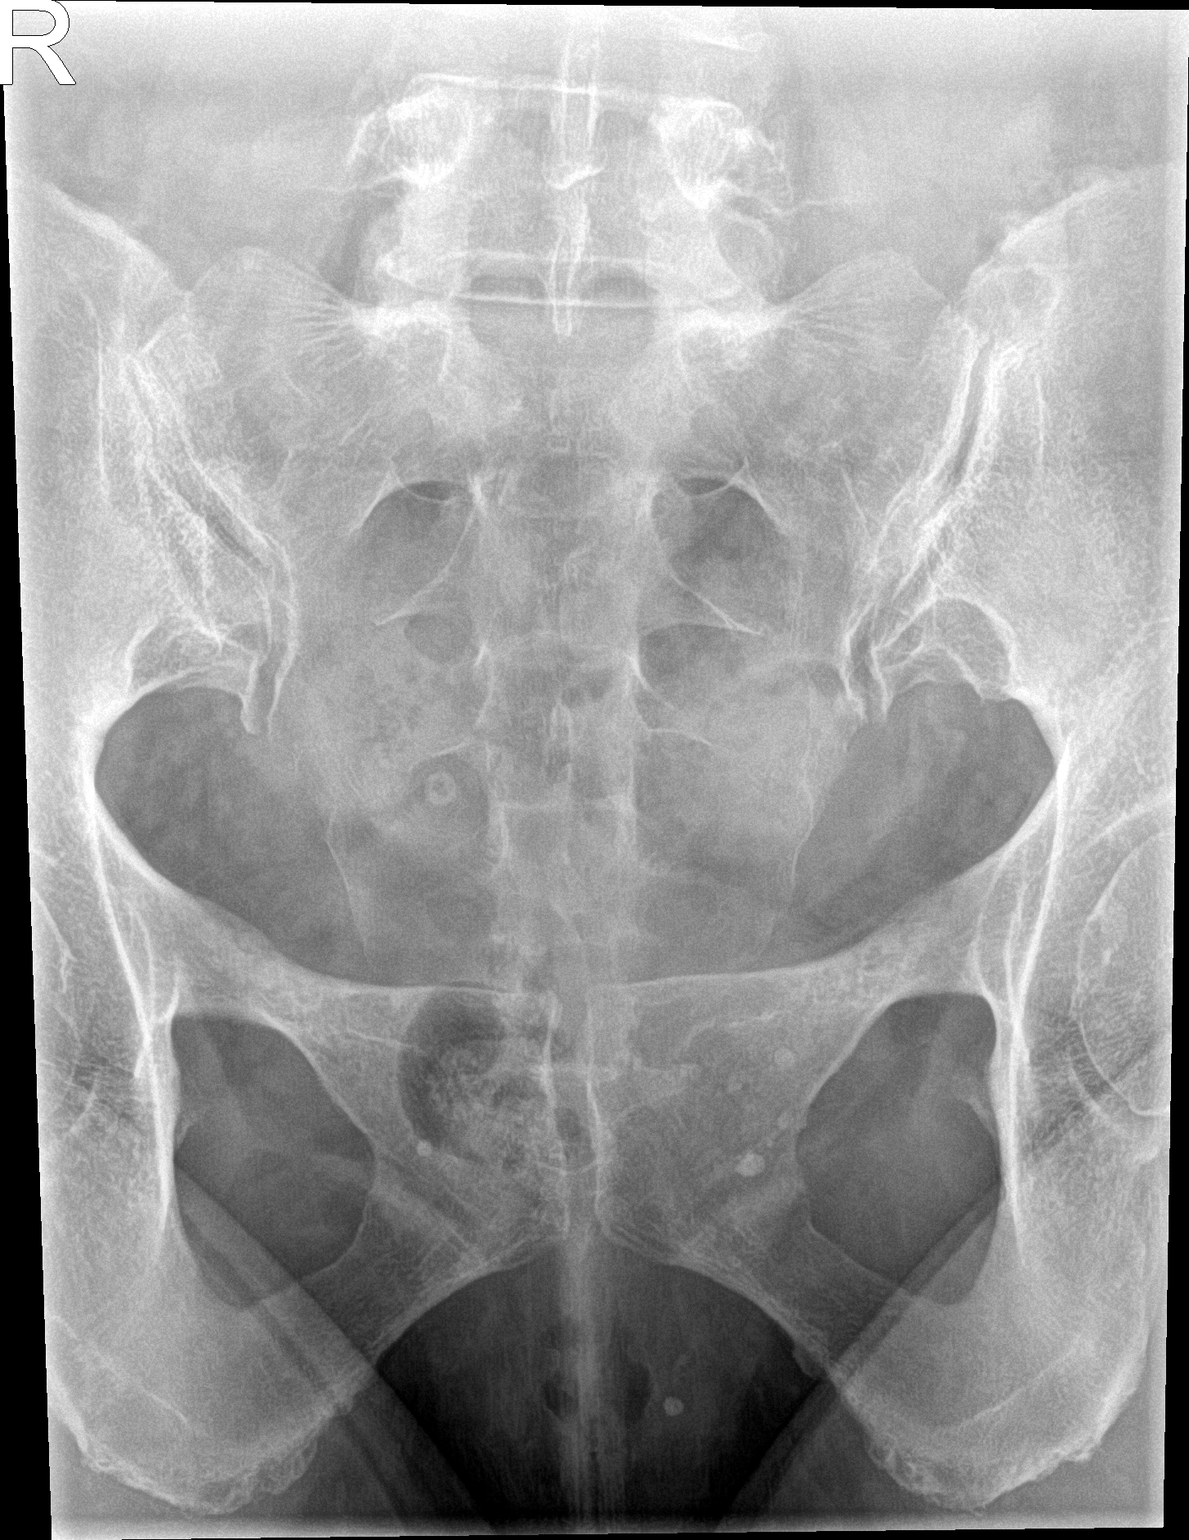

[sacrum ap]
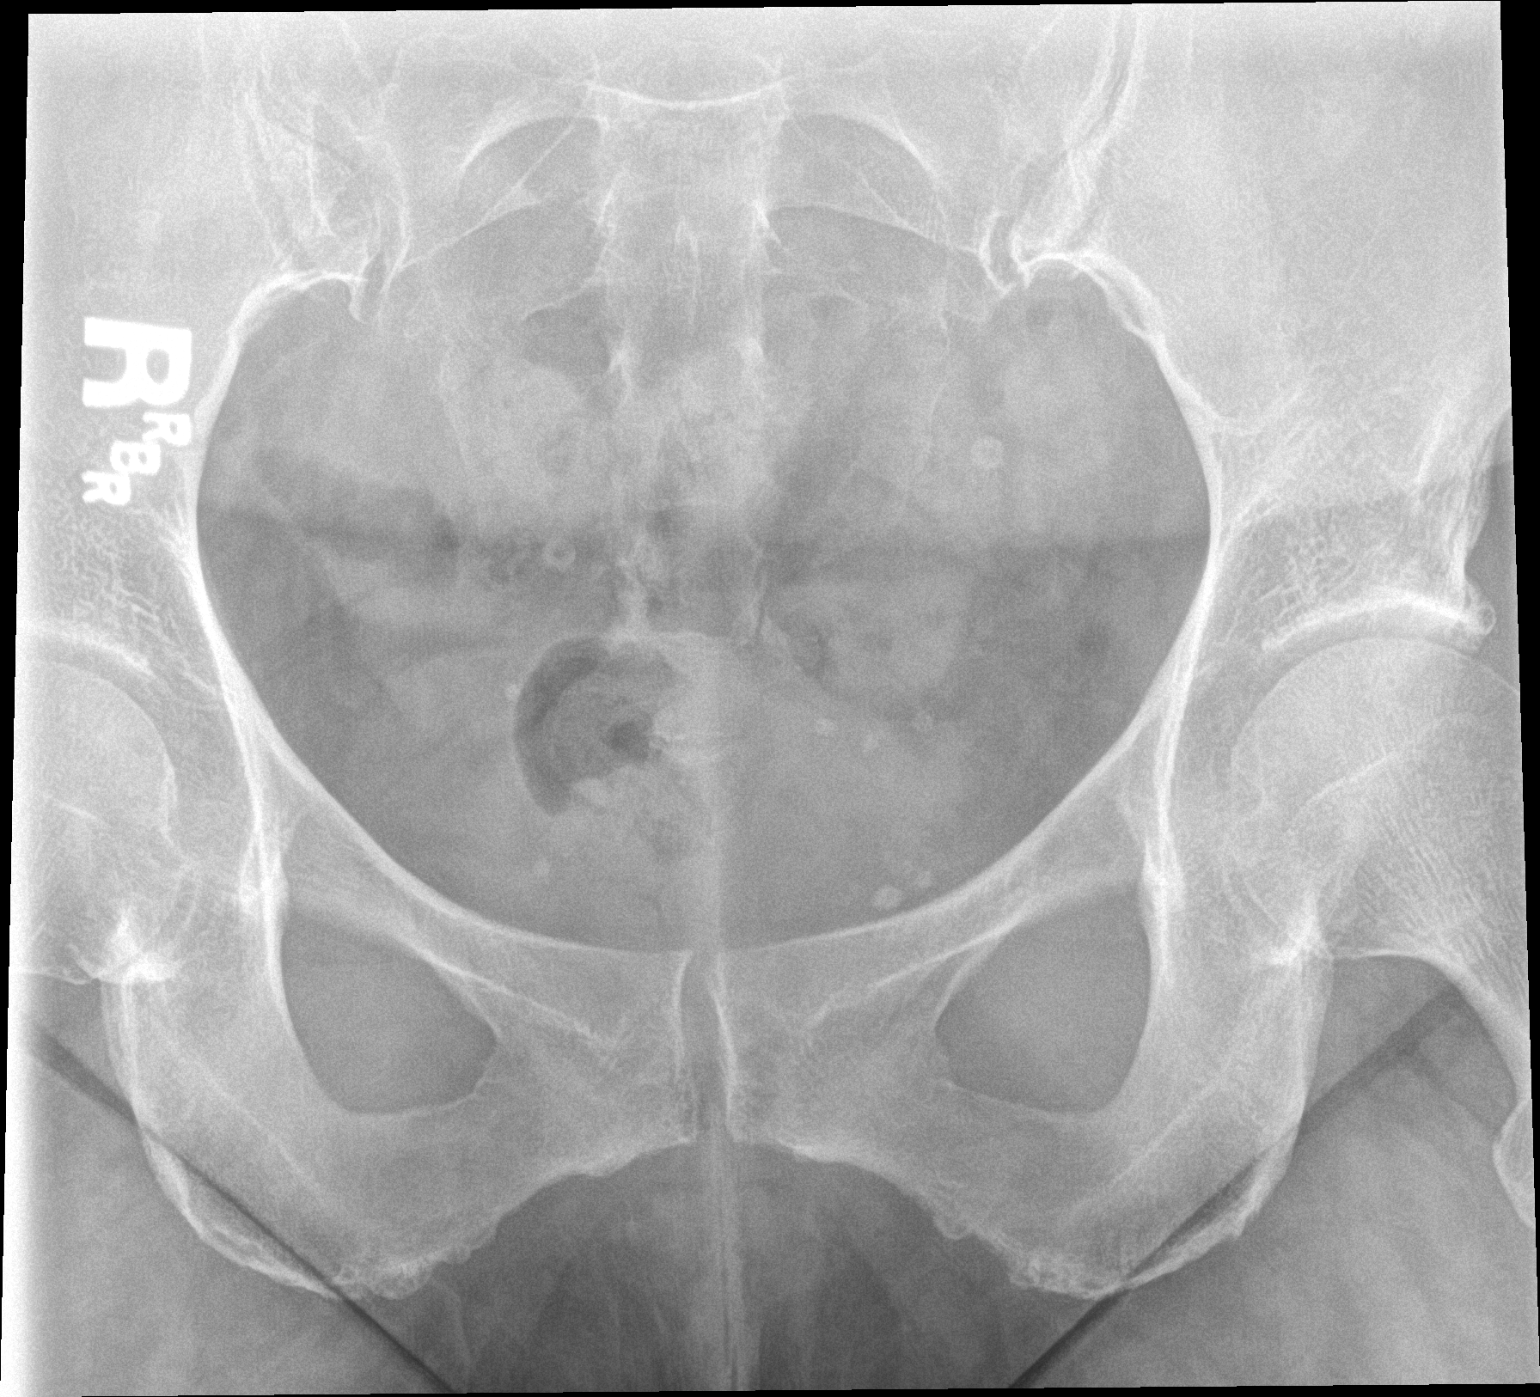

[sacrum lat]
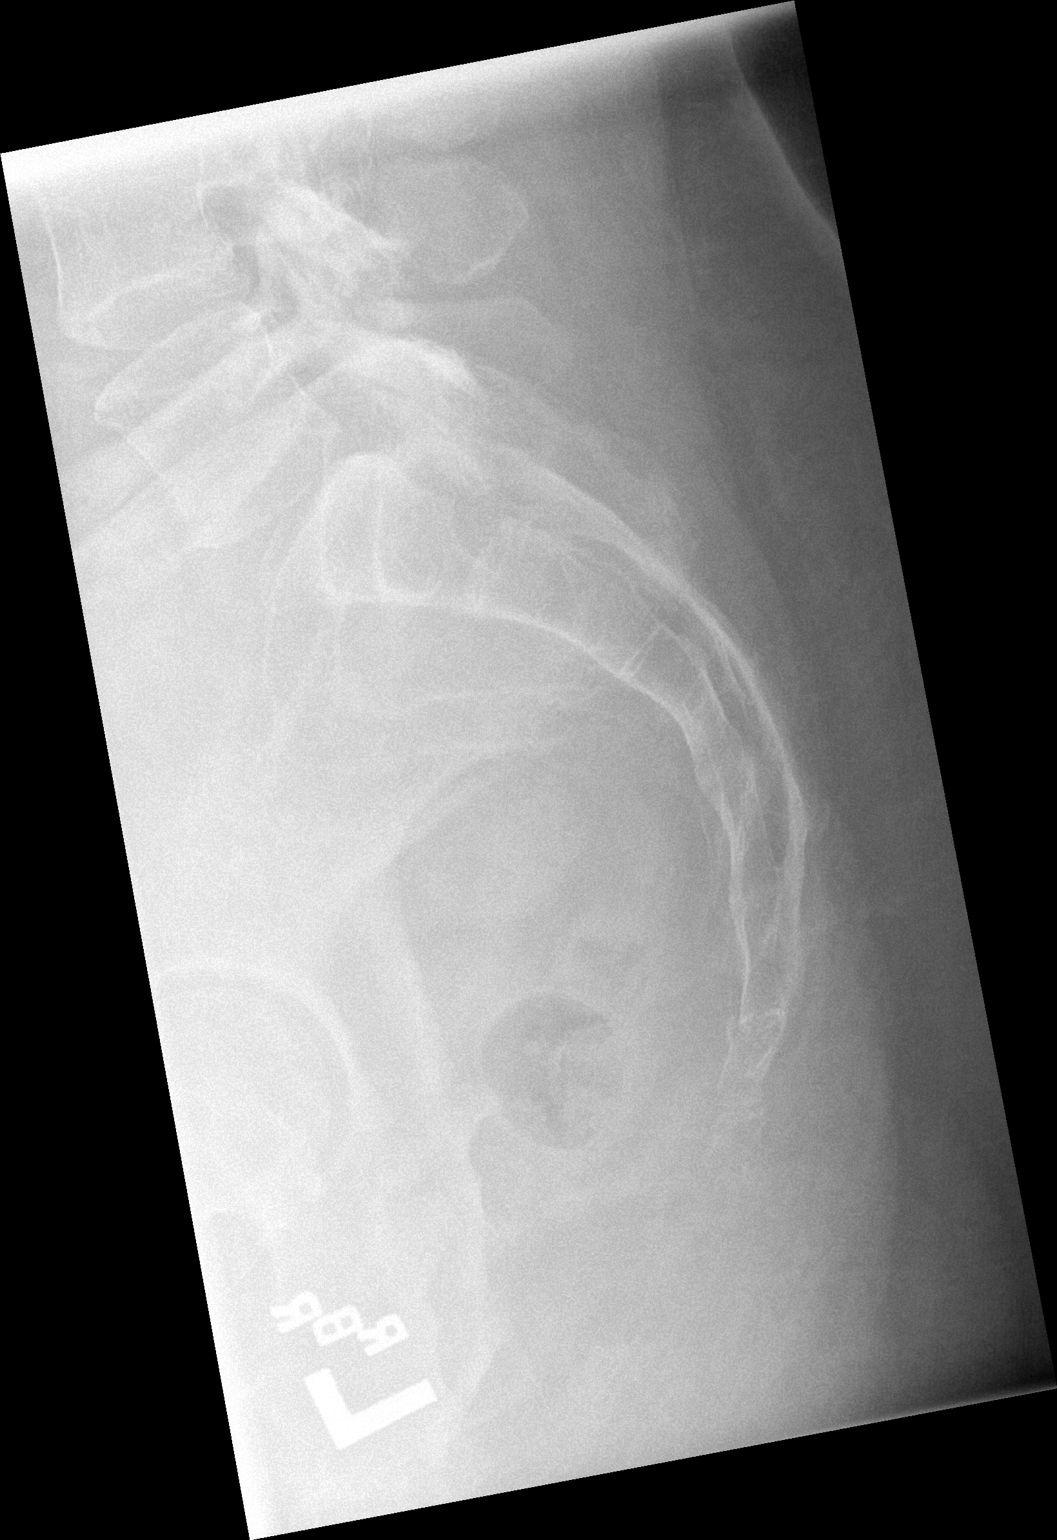

[3 of 3 positions shown; findings below may reference images not displayed]

FINDINGS: Subtle fracture of the coccyx cannot be excluded. No other focal
abnormality identified. Degenerative changes SI joints. Degenerative
changes lumbar spine .
IMPRESSION: Subtle fracture of the coccyx cannot be excluded.

## 2016-04-30 ENCOUNTER — Ambulatory Visit: Payer: Self-pay | Admitting: Family

## 2016-05-07 ENCOUNTER — Encounter: Payer: Self-pay | Admitting: Family

## 2016-05-07 ENCOUNTER — Ambulatory Visit (INDEPENDENT_AMBULATORY_CARE_PROVIDER_SITE_OTHER): Payer: BLUE CROSS/BLUE SHIELD | Admitting: Family

## 2016-05-07 VITALS — BP 94/77 | HR 62 | Temp 98.7°F | Resp 16 | Ht 63.0 in | Wt 214.0 lb

## 2016-05-07 DIAGNOSIS — R739 Hyperglycemia, unspecified: Secondary | ICD-10-CM

## 2016-05-07 DIAGNOSIS — R635 Abnormal weight gain: Secondary | ICD-10-CM

## 2016-05-07 DIAGNOSIS — F329 Major depressive disorder, single episode, unspecified: Secondary | ICD-10-CM

## 2016-05-07 DIAGNOSIS — E785 Hyperlipidemia, unspecified: Secondary | ICD-10-CM

## 2016-05-07 DIAGNOSIS — F32A Depression, unspecified: Secondary | ICD-10-CM

## 2016-05-07 LAB — LIPID PANEL
CHOLESTEROL: 161 mg/dL (ref 0–200)
HDL: 43.5 mg/dL (ref >39.00)
LDL Cholesterol: 99 mg/dL (ref 0–99)
NonHDL: 117.4
TRIGLYCERIDES: 94 mg/dL (ref 0.0–149.0)
Total CHOL/HDL Ratio: 4
VLDL: 18.8 mg/dL (ref 0.0–40.0)

## 2016-05-07 LAB — HEMOGLOBIN A1C: HEMOGLOBIN A1C: 6 % (ref 4.6–6.5)

## 2016-05-07 NOTE — Progress Notes (Signed)
Subjective:    Patient ID: Amanda Oconnor, female    DOB: 1956-02-02, 60 y.o.   MRN: [id number]  HPI  Amanda Oconnor is a 60 yr old female who presents today for follow up.  1) Depression- currently maintained on zoloft. Reports mood and motivation are good. Recently started estradiol and believes that this makes her "more teary."    2) Weight gain- reports that she lost a few pounds last month with a GI bug.  Also getting over an abscessed tooth which she recently had extracted.  Wt Readings from Last 3 Encounters:  05/07/16 214 lb (97.1 kg)  02/11/16 217 lb (98.4 kg)  10/23/15 214 lb 3.2 oz (97.2 kg)   3) Hyperlipidemia- maintained on simvastatin. Denies myalgia.   Lab Results  Component Value Date   CHOL 238 (H) 10/23/2015   HDL 47.80 10/23/2015   LDLCALC 168 (H) 10/23/2015   TRIG 112.0 10/23/2015   CHOLHDL 5 10/23/2015   Hyperglycemia- last A1C was elevated.   Lab Results  Component Value Date   HGBA1C 6.2 10/29/2015      Review of Systems See HPI  Past Medical History:  Diagnosis Date  . Allergy   . Benign microscopic hematuria    per pt she has had extensive w/u which was negative  . Depression   . Fibrocystic breast disease   . History of chicken pox   . Hyperlipidemia   . Vaginal Pap smear, abnormal    at age 89  . Vitamin D deficiency      Social History   Social History  . Marital status: Married    Spouse name: N/A  . Number of children: N/A  . Years of education: N/A   Occupational History  . Not on file.   Social History Main Topics  . Smoking status: Never Smoker  . Smokeless tobacco: Never Used  . Alcohol use No  . Drug use: No  . Sexual activity: Not on file   Other Topics Concern  . Not on file   Social History Narrative   3 children   5 grandchildren   Married second marriage.  Husband is in [place].   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 ABLATION  2009  . TONSILLECTOMY     age 79  . TOOTH EXTRACTION  09/22/14   pt reported    Family History  Problem Relation Age of Onset  . Arthritis Mother   . Cancer Mother     lung  . Hyperlipidemia Mother   . Heart disease Mother   . Diabetes Mother   . Tuberculosis Mother   . Fibrocystic breast disease Mother   . Cancer Father     prostate  . Tuberculosis Father   . Alcohol abuse Father   . Gout Father   . Heart disease Sister   . Stroke Brother   . Heart disease Brother     Allergies  Allergen Reactions  . Contrast Media [Iodinated Diagnostic Agents] Shortness Of Breath  . Penicillins Hives  . Sulfa Antibiotics Rash    Current Outpatient Prescriptions on File Prior to Visit  Medication Sig Dispense Refill  . Calcium Carbonate (CALTRATE 600 PO) Take 1 tablet by mouth 2 (two) times daily.    . Cholecalciferol (VITAMIN D PO) Take 500 Units by mouth daily.    Marland Kitchen  estradiol (ESTRACE) 0.5 MG tablet Take 2 tablets (1 mg total) by mouth daily. 30 tablet 3  . medroxyPROGESTERone (PROVERA) 2.5 MG tablet Take 1 tablet (2.5 mg total) by mouth daily. On days 1-5 of the month 5 tablet 3  . sertraline (ZOLOFT) 100 MG tablet TAKE 1 TABLET BY MOUTH EVERY DAY 30 tablet 5  . simvastatin (ZOCOR) 40 MG tablet Take 1 tablet (40 mg total) by mouth at bedtime. 90 tablet 0   No current facility-administered medications on file prior to visit.     BP 94/77 (BP Location: Right Arm, Cuff Size: Large)   Pulse 62   Temp 98.7 F (37.1 C) (Oral)   Resp 16   Ht 5\' 3"  (1.6 m)   Wt 214 lb (97.1 kg)   SpO2 98% Comment: room air  BMI 37.91 kg/m       Objective:   Physical Exam  Constitutional: She is oriented to person, place, and time. She appears well-developed and well-nourished.  HENT:  Head: Normocephalic and atraumatic.  Cardiovascular: Normal rate, regular rhythm and normal heart sounds.   No murmur  heard. Pulmonary/Chest: Effort normal and breath sounds normal. No respiratory distress. She has no wheezes.  Neurological: She is alert and oriented to person, place, and time.  Skin: Skin is warm and dry.  Psychiatric: She has a normal mood and affect. Her behavior is normal. Judgment and thought content normal.              Assessment & Plan:         Assessment & Plan:

## 2016-05-07 NOTE — Assessment & Plan Note (Signed)
Obtain follow up A1C.   

## 2016-05-07 NOTE — Assessment & Plan Note (Signed)
She is down a few pounds. Continue weight loss efforts.

## 2016-05-07 NOTE — Assessment & Plan Note (Signed)
Stable on zoloft, continue same.  

## 2016-05-07 NOTE — Assessment & Plan Note (Signed)
Uncontrolled, tolerating statin, obtain follow up lipid panel.

## 2016-05-07 NOTE — Progress Notes (Signed)
Pre visit review using our clinic review tool, if applicable. No additional management support is needed unless otherwise documented below in the visit note. 

## 2016-05-07 NOTE — Patient Instructions (Signed)
Please complete lab work prior to leaving. Continue to work on healthy diet, exercise and weight loss.  

## 2016-05-26 ENCOUNTER — Other Ambulatory Visit: Payer: Self-pay | Admitting: Family

## 2016-07-29 ENCOUNTER — Other Ambulatory Visit: Payer: Self-pay | Admitting: Family

## 2016-08-19 ENCOUNTER — Ambulatory Visit (INDEPENDENT_AMBULATORY_CARE_PROVIDER_SITE_OTHER): Payer: Self-pay

## 2016-08-19 ENCOUNTER — Encounter (INDEPENDENT_AMBULATORY_CARE_PROVIDER_SITE_OTHER): Payer: Self-pay | Admitting: Orthopaedic Surgery

## 2016-08-19 ENCOUNTER — Ambulatory Visit (INDEPENDENT_AMBULATORY_CARE_PROVIDER_SITE_OTHER): Payer: BLUE CROSS/BLUE SHIELD | Admitting: Orthopaedic Surgery

## 2016-08-19 VITALS — BP 119/80 | HR 65 | Resp 14 | Ht 63.0 in | Wt 214.0 lb

## 2016-08-19 DIAGNOSIS — S86111A Strain of other muscle(s) and tendon(s) of posterior muscle group at lower leg level, right leg, initial encounter: Secondary | ICD-10-CM

## 2016-08-19 DIAGNOSIS — M25561 Pain in right knee: Secondary | ICD-10-CM | POA: Diagnosis not present

## 2016-08-19 DIAGNOSIS — M1711 Unilateral primary osteoarthritis, right knee: Secondary | ICD-10-CM

## 2016-08-19 DIAGNOSIS — M1712 Unilateral primary osteoarthritis, left knee: Secondary | ICD-10-CM

## 2016-08-19 DIAGNOSIS — M25562 Pain in left knee: Secondary | ICD-10-CM | POA: Diagnosis not present

## 2016-08-19 DIAGNOSIS — G8929 Other chronic pain: Secondary | ICD-10-CM

## 2016-08-19 MED ORDER — METHYLPREDNISOLONE ACETATE 40 MG/ML IJ SUSP
80.0000 mg | INTRAMUSCULAR | Status: AC | PRN
  Administered 2016-08-19: 80 mg

## 2016-08-19 MED ORDER — LIDOCAINE HCL 1 % IJ SOLN
5.0000 mL | INTRAMUSCULAR | Status: AC | PRN
  Administered 2016-08-19: 5 mL

## 2016-08-19 MED ORDER — BUPIVACAINE HCL 0.5 % IJ SOLN
3.0000 mL | INTRAMUSCULAR | Status: AC | PRN
  Administered 2016-08-19: 3 mL via INTRA_ARTICULAR

## 2016-08-19 NOTE — Progress Notes (Signed)
Office Visit Note   Patient: Amanda Oconnor           Date of Birth: 1956-03-28           MRN: [id number] Visit Date: 08/19/2016              Requested by: Debbrah Alar, NP [address] [place], [place] [address] PCP: Nance Pear., NP   Assessment & Plan: Visit Diagnoses:  1. Gastrocnemius muscle tear, right, initial encounter   2. Chronic pain of left knee   3. Chronic pain of right knee   4. Unilateral primary osteoarthritis, right knee   5. Unilateral primary osteoarthritis, left knee     Plan: #1: Equalizer boot to the right leg #2: Corticosteroid injection to the left knee #3: Ice and elevate the right calf and left knee  Follow-Up Instructions: Return in about 2 weeks (around 09/02/2016).   Orders:  Orders Placed This Encounter  Procedures  . Large Joint Injection/Arthrocentesis  . XR KNEE 3 VIEW LEFT  . XR KNEE 3 VIEW RIGHT   No orders of the defined types were placed in this encounter.     Procedures: Large Joint Inj Date/Time: 08/19/2016 5:55 PM Performed by: Biagio Borg D Authorized by: Biagio Borg D   Consent Given by:  Patient Timeout: prior to procedure the correct patient, procedure, and site was verified   Indications:  Pain and joint swelling Location:  Knee Site:  L knee Prep: patient was prepped and draped in usual sterile fashion   Needle Size:  25 G Needle Length:  1.5 inches Approach:  Anteromedial Ultrasound Guidance: No   Fluoroscopic Guidance: No   Arthrogram: No   Medications:  5 mL lidocaine 1 %; 80 mg methylPREDNISolone acetate 40 MG/ML; 3 mL bupivacaine 0.5 % Aspiration Attempted: No   Patient tolerance:  Patient tolerated the procedure well with no immediate complications     Clinical Data: No additional findings.   Subjective: Chief Complaint  Patient presents with  . Left Knee - Pain, Edema  . Right Knee - Pain    Pt presents with chronic left knee pain, denies injury. She  relates the pain is mostly a 6/10 pain all the time. She takes Advil for pain. She did visit for same problem 10/2014.  Pt also has Right calf pain, she "felt" something pop when reaching into her car, shooting pain behind the knee. This happened Sunday, 08/18/15.       Review of Systems  Constitutional: Negative.   HENT: Negative.   Respiratory: Negative.   Cardiovascular: Negative.   Gastrointestinal: Negative.   Endocrine:       States prediabetic  Genitourinary: Negative.   Skin: Negative.   Neurological: Negative.   Hematological: Negative.   Psychiatric/Behavioral: Negative.      Objective: Vital Signs: BP 119/80   Pulse 65   Resp 14   Ht 5\' 3"  (1.6 m)   Wt 214 lb (97.1 kg)   BMI 37.91 kg/m   Physical Exam  Constitutional: She is oriented to person, place, and time. She appears well-developed and well-nourished.  HENT:  Head: Normocephalic and atraumatic.  Eyes: EOM are normal. Pupils are equal, round, and reactive to light.  Pulmonary/Chest: Effort normal.  Musculoskeletal:       Left knee: She exhibits effusion (trace).  Neurological: She is alert and oriented to person, place, and time.  Skin: Skin is warm and dry.  Psychiatric: She has a normal mood and  affect. Her behavior is normal. Judgment and thought content normal.    Right Knee Exam   Comments:  She is tender about the right posterior aspect of the knee as well as into the gastroc to the midportion. He does have some swelling noted. Not tense. She does have some pain when I put her into full extension of the knee and stress the gastroc.   Left Knee Exam   Tenderness  The patient is experiencing tenderness in the lateral joint line and medial joint line.  Range of Motion  Extension: 5  Flexion: 100   Tests  Lachman:  Anterior - negative     Drawer:       Anterior - negative      Varus: negative Valgus: negative  Other  Effusion: effusion (trace) present      Specialty Comments:    No specialty comments available.  Imaging: Xr Knee 3 View Left  Result Date: 08/19/2016 Three-view x-ray of the left knee reveals mild medial compartment narrowing with sclerosis of the left medial tibial plateau. She may have a small fragmentation of the proximal portion of the fibular head. There is calcification in the posterior of the knee on the lateral that a possible loose body  Xr Knee 3 View Right  Result Date: 08/19/2016 Three-view x-ray of the right knee reveals medial compartment narrowing with sclerosis of the medial tibial plateau. Lateral distal femoral condyle reveals some irregularity    PMFS History: Patient Active Problem List   Diagnosis Date Noted  . Sinusitis 08/06/2015  . Hyperglycemia 06/12/2015  . Osteopenia 06/01/2015  . Heme positive stool 08/25/2014  . Preventative health care 08/09/2014  . Recurrent sinus infections 08/09/2014  . Hypoglycemia 06/26/2014  . Depression 11/21/2013  . Benign microscopic hematuria 11/21/2013  . Hyperlipidemia 11/21/2013  . Vitamin D deficiency 11/21/2013  . Weight gain 11/21/2013   Past Medical History:  Diagnosis Date  . Allergy   . Benign microscopic hematuria    per pt she has had extensive w/u which was negative  . Depression   . Fibrocystic breast disease   . History of chicken pox   . Hyperlipidemia   . Vaginal Pap smear, abnormal    at age 61  . Vitamin D deficiency     Family History  Problem Relation Age of Onset  . Arthritis Mother   . Cancer Mother     lung  . Hyperlipidemia Mother   . Heart disease Mother   . Diabetes Mother   . Tuberculosis Mother   . Fibrocystic breast disease Mother   . Cancer Father     prostate  . Tuberculosis Father   . Alcohol abuse Father   . Gout Father   . Heart disease Sister   . Stroke Brother   . Heart disease Brother     Past Surgical History:  Procedure Laterality Date  . ABLATION  2009  . TONSILLECTOMY     age 16  . TOOTH EXTRACTION  09/22/14   pt  reported   Social History   Occupational History  . Not on file.   Social History Main Topics  . Smoking status: Never Smoker  . Smokeless tobacco: Never Used  . Alcohol use No  . Drug use: No  . Sexual activity: Not on file

## 2016-08-20 DIAGNOSIS — H35363 Drusen (degenerative) of macula, bilateral: Secondary | ICD-10-CM | POA: Diagnosis not present

## 2016-09-01 ENCOUNTER — Ambulatory Visit (INDEPENDENT_AMBULATORY_CARE_PROVIDER_SITE_OTHER): Payer: BLUE CROSS/BLUE SHIELD | Admitting: Orthopaedic Surgery

## 2016-09-01 ENCOUNTER — Encounter (INDEPENDENT_AMBULATORY_CARE_PROVIDER_SITE_OTHER): Payer: Self-pay | Admitting: Orthopaedic Surgery

## 2016-09-01 VITALS — BP 129/79 | HR 70 | Ht 63.0 in | Wt 214.0 lb

## 2016-09-01 DIAGNOSIS — M79604 Pain in right leg: Secondary | ICD-10-CM | POA: Diagnosis not present

## 2016-09-01 NOTE — Progress Notes (Signed)
Office Visit Note   Patient: Amanda Oconnor           Date of Birth: 1956-05-23           MRN: [id number] Visit Date: 09/01/2016              Requested by: Debbrah Alar, NP [address] [place], [place] [address] PCP: Nance Pear., NP   Assessment & Plan: Visit Diagnoses: Gastrocnemius muscle pull right leg, osteoarthritis left knee   Plan: Continue with the equalizer boot right lower extremity as she is improving. Gradually wean from the boot over the next several weeks if comfortable.  left knee doing well with cortisone injection  Follow-Up Instructions: No Follow-up on file.   Orders:  No orders of the defined types were placed in this encounter.  No orders of the defined types were placed in this encounter.     Procedures: No procedures performed   Clinical Data: No additional findings.   Subjective: No chief complaint on file.   Amanda Oconnor is a 61 year old female that is here for a follow up from her Gastrocnemius muscle tear, right, equalizer boot helps wears it 90% of the time and the pain is much better. She also relates her left is better after the corticoid injection 2 weeks ago. No numbness or tingling in the right foot.    Review of Systems   Objective: Vital Signs: There were no vitals taken for this visit.  Physical Exam  Ortho Exam resolving ecchymosis along the right tibia with minimal discomfort. Mild pain area of prior muscles pull. No popliteal pain. No knee pain. No knee effusion. No swelling distally neurovascular exam intact.  Left knee without significant localized tenderness. No effusion. Quick full range of motion without discomfort  Specialty Comments:  No specialty comments available.  Imaging: No results found.   PMFS History: Patient Active Problem List   Diagnosis Date Noted  . Sinusitis 08/06/2015  . Hyperglycemia 06/12/2015  . Osteopenia 06/01/2015  . Heme positive stool 08/25/2014  .  Preventative health care 08/09/2014  . Recurrent sinus infections 08/09/2014  . Hypoglycemia 06/26/2014  . Depression 11/21/2013  . Benign microscopic hematuria 11/21/2013  . Hyperlipidemia 11/21/2013  . Vitamin D deficiency 11/21/2013  . Weight gain 11/21/2013   Past Medical History:  Diagnosis Date  . Allergy   . Benign microscopic hematuria    per pt she has had extensive w/u which was negative  . Depression   . Fibrocystic breast disease   . History of chicken pox   . Hyperlipidemia   . Vaginal Pap smear, abnormal    at age 63  . Vitamin D deficiency     Family History  Problem Relation Age of Onset  . Arthritis Mother   . Cancer Mother     lung  . Hyperlipidemia Mother   . Heart disease Mother   . Diabetes Mother   . Tuberculosis Mother   . Fibrocystic breast disease Mother   . Cancer Father     prostate  . Tuberculosis Father   . Alcohol abuse Father   . Gout Father   . Heart disease Sister   . Stroke Brother   . Heart disease Brother     Past Surgical History:  Procedure Laterality Date  . ABLATION  2009  . TONSILLECTOMY     age 29  . TOOTH EXTRACTION  09/22/14   pt reported   Social History   Occupational  History  . Not on file.   Social History Main Topics  . Smoking status: Never Smoker  . Smokeless tobacco: Never Used  . Alcohol use No  . Drug use: No  . Sexual activity: Not on file

## 2016-10-02 DIAGNOSIS — H5212 Myopia, left eye: Secondary | ICD-10-CM | POA: Diagnosis not present

## 2016-10-02 DIAGNOSIS — H524 Presbyopia: Secondary | ICD-10-CM | POA: Diagnosis not present

## 2016-10-02 DIAGNOSIS — H353131 Nonexudative age-related macular degeneration, bilateral, early dry stage: Secondary | ICD-10-CM | POA: Diagnosis not present

## 2016-10-02 DIAGNOSIS — H52223 Regular astigmatism, bilateral: Secondary | ICD-10-CM | POA: Diagnosis not present

## 2016-10-02 DIAGNOSIS — H2513 Age-related nuclear cataract, bilateral: Secondary | ICD-10-CM | POA: Diagnosis not present

## 2016-12-01 ENCOUNTER — Other Ambulatory Visit: Payer: Self-pay | Admitting: *Deleted

## 2016-12-01 MED ORDER — SIMVASTATIN 40 MG PO TABS: ORAL_TABLET | ORAL | 0 refills | Status: DC

## 2016-12-01 NOTE — Progress Notes (Signed)
Faxed refill request received from [facility] for 90-day supply Simvastatin 40 mg tablet Last filled by MD on 05/26/16, #90x1 Refill sent per [facility] refill protocol/SLS

## 2017-01-19 ENCOUNTER — Encounter: Payer: Self-pay | Admitting: Gastroenterology

## 2017-01-19 ENCOUNTER — Encounter (HOSPITAL_COMMUNITY): Payer: Self-pay | Admitting: Emergency Medicine

## 2017-01-19 ENCOUNTER — Ambulatory Visit (HOSPITAL_COMMUNITY)
Admission: EM | Admit: 2017-01-19 | Discharge: 2017-01-19 | Disposition: A | Discharge status: Home / Self Care | Payer: BLUE CROSS/BLUE SHIELD | Attending: Family Medicine | Admitting: Family Medicine

## 2017-01-19 DIAGNOSIS — E785 Hyperlipidemia, unspecified: Secondary | ICD-10-CM | POA: Insufficient documentation

## 2017-01-19 DIAGNOSIS — K219 Gastro-esophageal reflux disease without esophagitis: Secondary | ICD-10-CM | POA: Insufficient documentation

## 2017-01-19 DIAGNOSIS — Z91041 Radiographic dye allergy status: Secondary | ICD-10-CM | POA: Insufficient documentation

## 2017-01-19 DIAGNOSIS — Z811 Family history of alcohol abuse and dependence: Secondary | ICD-10-CM | POA: Insufficient documentation

## 2017-01-19 DIAGNOSIS — Z880 Allergy status to penicillin: Secondary | ICD-10-CM | POA: Diagnosis not present

## 2017-01-19 DIAGNOSIS — Z79899 Other long term (current) drug therapy: Secondary | ICD-10-CM | POA: Insufficient documentation

## 2017-01-19 DIAGNOSIS — E559 Vitamin D deficiency, unspecified: Secondary | ICD-10-CM | POA: Insufficient documentation

## 2017-01-19 DIAGNOSIS — F329 Major depressive disorder, single episode, unspecified: Secondary | ICD-10-CM | POA: Insufficient documentation

## 2017-01-19 DIAGNOSIS — Z801 Family history of malignant neoplasm of trachea, bronchus and lung: Secondary | ICD-10-CM | POA: Insufficient documentation

## 2017-01-19 DIAGNOSIS — Z8042 Family history of malignant neoplasm of prostate: Secondary | ICD-10-CM | POA: Insufficient documentation

## 2017-01-19 DIAGNOSIS — R1084 Generalized abdominal pain: Secondary | ICD-10-CM | POA: Insufficient documentation

## 2017-01-19 DIAGNOSIS — Z882 Allergy status to sulfonamides status: Secondary | ICD-10-CM | POA: Diagnosis not present

## 2017-01-19 DIAGNOSIS — R109 Unspecified abdominal pain: Secondary | ICD-10-CM

## 2017-01-19 DIAGNOSIS — K589 Irritable bowel syndrome without diarrhea: Secondary | ICD-10-CM | POA: Diagnosis not present

## 2017-01-19 DIAGNOSIS — Z8249 Family history of ischemic heart disease and other diseases of the circulatory system: Secondary | ICD-10-CM | POA: Diagnosis not present

## 2017-01-19 DIAGNOSIS — Z8261 Family history of arthritis: Secondary | ICD-10-CM | POA: Diagnosis not present

## 2017-01-19 LAB — CBC WITH DIFFERENTIAL/PLATELET
BASOS ABS: 0 10*3/uL (ref 0.0–0.1)
BASOS PCT: 0 %
EOS PCT: 1 %
Eosinophils Absolute: 0.1 10*3/uL (ref 0.0–0.7)
HEMATOCRIT: 43.3 % (ref 36.0–46.0)
Hemoglobin: 14.5 g/dL (ref 12.0–15.0)
Lymphocytes Relative: 32 %
Lymphs Abs: 2.4 10*3/uL (ref 0.7–4.0)
MCH: 29.6 pg (ref 26.0–34.0)
MCHC: 33.5 g/dL (ref 30.0–36.0)
MCV: 88.4 fL (ref 78.0–100.0)
MONO ABS: 0.8 10*3/uL (ref 0.1–1.0)
MONOS PCT: 10 %
Neutro Abs: 4.2 10*3/uL (ref 1.7–7.7)
Neutrophils Relative %: 57 %
PLATELETS: 274 10*3/uL (ref 150–400)
RBC: 4.9 MIL/uL (ref 3.87–5.11)
RDW: 12.5 % (ref 11.5–15.5)
WBC: 7.5 10*3/uL (ref 4.0–10.5)

## 2017-01-19 LAB — POCT URINALYSIS DIP (DEVICE)
BILIRUBIN URINE: NEGATIVE
Glucose, UA: NEGATIVE mg/dL
Ketones, ur: NEGATIVE mg/dL
LEUKOCYTES UA: NEGATIVE
NITRITE: NEGATIVE
PH: 6 (ref 5.0–8.0)
Protein, ur: NEGATIVE mg/dL
Specific Gravity, Urine: 1.015 (ref 1.005–1.030)
UROBILINOGEN UA: 0.2 mg/dL (ref 0.0–1.0)

## 2017-01-19 LAB — COMPREHENSIVE METABOLIC PANEL
ALBUMIN: 4.1 g/dL (ref 3.5–5.0)
ALK PHOS: 59 U/L (ref 38–126)
ALT: 56 U/L — ABNORMAL HIGH (ref 14–54)
AST: 35 U/L (ref 15–41)
Anion gap: 8 (ref 5–15)
BILIRUBIN TOTAL: 0.7 mg/dL (ref 0.3–1.2)
BUN: 9 mg/dL (ref 6–20)
CALCIUM: 9.1 mg/dL (ref 8.9–10.3)
CO2: 28 mmol/L (ref 22–32)
CREATININE: 0.75 mg/dL (ref 0.44–1.00)
Chloride: 104 mmol/L (ref 101–111)
GFR calc Af Amer: >60 mL/min (ref >60)
GFR calc non Af Amer: >60 mL/min (ref >60)
GLUCOSE: 90 mg/dL (ref 65–99)
Potassium: 3.3 mmol/L — ABNORMAL LOW (ref 3.5–5.1)
SODIUM: 140 mmol/L (ref 135–145)
TOTAL PROTEIN: 7.3 g/dL (ref 6.5–8.1)

## 2017-01-19 LAB — POCT H PYLORI SCREEN: H. PYLORI SCREEN, POC: NEGATIVE

## 2017-01-19 MED ORDER — ONDANSETRON 4 MG PO TBDP
ORAL_TABLET | ORAL | Status: AC
  Filled 2017-01-19: qty 2

## 2017-01-19 MED ORDER — GI COCKTAIL ~~LOC~~
ORAL | Status: AC
  Filled 2017-01-19: qty 30

## 2017-01-19 MED ORDER — GI COCKTAIL ~~LOC~~
30.0000 mL | Freq: Once | ORAL | Status: AC
  Administered 2017-01-19: 30 mL via ORAL

## 2017-01-19 MED ORDER — ONDANSETRON 4 MG PO TBDP: 4.0000 mg | ORAL_TABLET | Freq: Three times a day (TID) | ORAL | 0 refills | Status: DC | PRN

## 2017-01-19 MED ORDER — DICYCLOMINE HCL 20 MG PO TABS: 20.0000 mg | ORAL_TABLET | Freq: Two times a day (BID) | ORAL | 0 refills | Status: DC

## 2017-01-19 MED ORDER — ONDANSETRON 4 MG PO TBDP
8.0000 mg | ORAL_TABLET | Freq: Once | ORAL | Status: AC
  Administered 2017-01-19: 8 mg via ORAL

## 2017-01-19 NOTE — ED Provider Notes (Signed)
CSN: [id number]     Arrival date & time 01/19/17  1336 History   None    Chief Complaint  Patient presents with  . Abdominal Pain   (Consider location/radiation/quality/duration/timing/severity/associated sxs/prior Treatment) Subjective:   Amanda Oconnor is a 61 y.o. female who presents for evaluation of abdominal pain. The pain is described as aching, burning, cramping and pressure-like, and is 9/10 in intensity. Pain is located in the epigastric, periumbilical bilateral, LLQ, RLQ without radiation. Onset was 1 week ago. Symptoms have been unchanged since. Aggravating factors: bowel movement, eating, pressure and recumbency.  Alleviating factors: bowel movements, eating and movement. Associated symptoms: diarrhea, flatus and nausea. The patient denies anorexia, constipation, hematochezia, melena, sweats and vomiting. She also denies travel or suspicious food intake. The following portions of the patient's history were reviewed and updated as appropriate: allergies, current medications, past family history, past medical history, past social history, past surgical history and problem list.  Review of Systems Pertinent items noted in HPI and remainder of comprehensive ROS otherwise negative.   Objective:  BP 113/81 (BP Location: Right Arm)   Pulse 64   Temp 98.4 F (36.9 C) (Oral)   Resp 16   SpO2 98%  General appearance: alert, cooperative, appears stated age and no distress Head: Normocephalic, without obvious abnormality, atraumatic Neck: no adenopathy and no JVD Lungs: clear to auscultation bilaterally Heart: regular rate and rhythm, S1, S2 normal, no murmur, click, rub or gallop Abdomen: abnormal findings:  hyperactive bowel sounds, obese and epigastric tenderness, - rebound, -McBurney  Extremities: extremities normal, atraumatic, no cyanosis or edema Pulses: 2+ and symmetric Skin: Skin color, texture, turgor normal. No rashes or lesions Neurologic: Grossly normal    Assessment:  Abdominal pain, non specific, Cholecystitis, Cholelithiasis, Gastroenteritis, Gastroesophageal Reflux, Irritable Bowel Syndrome   Plan:  The diagnosis was discussed with the patient and evaluation and treatment plans outlined. Adhere to simple, bland diet. Adhere to low fat diet. Initiate empiric trial of anti-emetics Initiate trial of anticholinergic medications for IBS. Further follow-up plans will be based on outcome of lab/imaging studies; see orders. Call back with update in 1 day if there are any laboratory findings that are significant.     The history is provided by the patient.    Past Medical History:  Diagnosis Date  . Allergy   . Benign microscopic hematuria    per pt she has had extensive w/u which was negative  . Depression   . Fibrocystic breast disease   . History of chicken pox   . Hyperlipidemia   . Vaginal Pap smear, abnormal    at age 37  . Vitamin D deficiency    Past Surgical History:  Procedure Laterality Date  . ABLATION  2009  . TONSILLECTOMY     age 62  . TOOTH EXTRACTION  09/22/14   pt reported   Family History  Problem Relation Age of Onset  . Arthritis Mother   . Cancer Mother        lung  . Hyperlipidemia Mother   . Heart disease Mother   . Diabetes Mother   . Tuberculosis Mother   . Fibrocystic breast disease Mother   . Cancer Father        prostate  . Tuberculosis Father   . Alcohol abuse Father   . Gout Father   . Heart disease Sister   . Stroke Brother   . Heart disease Brother    Social History  Substance Use Topics  . Smoking status:  Never Smoker  . Smokeless tobacco: Never Used  . Alcohol use No   OB History    Gravida Para Term Preterm AB Living   4 3 3          SAB TAB Ectopic Multiple Live Births           3     Review of Systems  Allergies  Contrast media [iodinated diagnostic agents]; Penicillins; and Sulfa antibiotics  Home Medications   Prior to Admission medications   Medication  Sig Start Date End Date Taking? Authorizing Provider  Calcium Carbonate (CALTRATE 600 PO) Take 1 tablet by mouth 2 (two) times daily.    [provider]  Cholecalciferol (VITAMIN D PO) Take 500 Units by mouth daily.    [provider]  dicyclomine (BENTYL) 20 MG tablet Take 1 tablet (20 mg total) by mouth 2 (two) times daily. 01/19/17   Barnet Glasgow, NP  estradiol (ESTRACE) 0.5 MG tablet Take 2 tablets (1 mg total) by mouth daily. 02/11/16   Lavonia Drafts, MD  ondansetron (ZOFRAN ODT) 4 MG disintegrating tablet Take 1 tablet (4 mg total) by mouth every 8 (eight) hours as needed for nausea or vomiting. 01/19/17   Barnet Glasgow, NP  sertraline (ZOLOFT) 100 MG tablet TAKE 1 TABLET BY MOUTH EVERY DAY 07/30/16   Debbrah Alar, NP  simvastatin (ZOCOR) 40 MG tablet TAKE 1 TABLET (40 MG TOTAL) BY MOUTH AT BEDTIME (MAX ON INS) 12/01/16   Debbrah Alar, NP   Meds Ordered and Administered this Visit   Medications  gi cocktail (Maalox,Lidocaine,Donnatal) (30 mLs Oral Given 01/19/17 1535)  ondansetron (ZOFRAN-ODT) disintegrating tablet 8 mg (8 mg Oral Given 01/19/17 1530)    BP 113/81 (BP Location: Right Arm)   Pulse 64   Temp 98.4 F (36.9 C) (Oral)   Resp 16   SpO2 98%  No data found.   Physical Exam  Urgent Care Course     Procedures (including critical care time)  Labs Review Labs Reviewed  COMPREHENSIVE METABOLIC PANEL - Abnormal; Notable for the following:       Result Value   Potassium 3.3 (*)    ALT 56 (*)    All other components within normal limits  POCT URINALYSIS DIP (DEVICE) - Abnormal; Notable for the following:    Hgb urine dipstick SMALL (*)    All other components within normal limits  GASTROINTESTINAL PANEL BY PCR, STOOL (REPLACES STOOL CULTURE)  CBC WITH DIFFERENTIAL/PLATELET  POCT H PYLORI SCREEN    Imaging Review No results found.   Visual Acuity Review  Right Eye Distance:   Left Eye Distance:   Bilateral  Distance:    Right Eye Near:   Left Eye Near:    Bilateral Near:         MDM   1. Abdominal pain, unspecified abdominal location     Assessment:  Abdominal pain, non specific, Cholecystitis, Cholelithiasis, Gastroenteritis, Gastroesophageal Reflux, Irritable Bowel Syndrome   Plan:  The diagnosis was discussed with the patient and evaluation and treatment plans outlined. Adhere to simple, bland diet. Adhere to low fat diet. Initiate empiric trial of anti-emetics Initiate trial of anticholinergic medications for IBS. Further follow-up plans will be based on outcome of lab/imaging studies; see orders. Referral to GI for further evaluation. Call back with update in 1 day if there are any laboratory findings that are significant.    Barnet Glasgow, NP 01/19/17 [phone]

## 2017-01-19 NOTE — Discharge Instructions (Signed)
Return your stool within 24 hours. We close at 8:00 tonight, and reopen at 10 AM in the morning. I prescribed Zofran for nausea, and Bentyl for abdominal pain and cramping. Clear liquid diet for next 24-48 hours, then a bland diet thereafter, and have made a referral to GI for follow-up. I'll be monitoring for your labs, and if needed I'll call in antibiotics to your pharmacy,

## 2017-01-19 NOTE — ED Triage Notes (Signed)
Abdominal pain and diarrhea, onset 7/21.  Symptoms everyday since then.  Has not seen a physician until now.

## 2017-01-29 ENCOUNTER — Encounter (HOSPITAL_COMMUNITY): Payer: Self-pay | Admitting: Emergency Medicine

## 2017-01-29 ENCOUNTER — Ambulatory Visit (HOSPITAL_COMMUNITY)
Admission: EM | Admit: 2017-01-29 | Discharge: 2017-01-29 | Disposition: A | Discharge status: Home / Self Care | Payer: BLUE CROSS/BLUE SHIELD | Attending: Internal Medicine | Admitting: Internal Medicine

## 2017-01-29 DIAGNOSIS — E860 Dehydration: Secondary | ICD-10-CM | POA: Diagnosis not present

## 2017-01-29 DIAGNOSIS — R197 Diarrhea, unspecified: Secondary | ICD-10-CM | POA: Diagnosis not present

## 2017-01-29 MED ORDER — DICYCLOMINE HCL 20 MG PO TABS: 20.0000 mg | ORAL_TABLET | Freq: Two times a day (BID) | ORAL | 0 refills | Status: DC

## 2017-01-29 NOTE — ED Triage Notes (Signed)
The patient presented to the [facility] with a complaint of epigastric abdominal pain for 4 weeks and dizziness that started today.

## 2017-01-29 NOTE — ED Provider Notes (Signed)
[facility]    CSN: [id number] Arrival date & time: 01/29/17  1058     History   Chief Complaint Chief Complaint  Patient presents with  . Abdominal Pain  . Dizziness    HPI Amanda Oconnor is a 61 y.o. female.   61 year old female comes in for dizziness and up, pain after being seen a week ago for abdominal pain. Patient states she continues to have diarrhea and generalized abdominal pain, associated with food intake. Bentyl that was prescribed a week ago has been helping with the symptoms. Denies blood in stool, nausea/vomiting, blood in the vomit. She states she has been eating less due to the stomach cramping. When she was at work this morning, she started up, and got dizzy with diaphoresis. She came to urgent care to get evaluated, but those symptoms have since resolved. She noticed that her abdominal pain is worse with food intake, especially fatty or acidic foods. Denies past abdominal surgeries. Denies history of diverticulitis.      Past Medical History:  Diagnosis Date  . Allergy   . Benign microscopic hematuria    per pt she has had extensive w/u which was negative  . Depression   . Fibrocystic breast disease   . History of chicken pox   . Hyperlipidemia   . Vaginal Pap smear, abnormal    at age 71  . Vitamin D deficiency     Patient Active Problem List   Diagnosis Date Noted  . Sinusitis 08/06/2015  . Hyperglycemia 06/12/2015  . Osteopenia 06/01/2015  . Heme positive stool 08/25/2014  . Preventative health care 08/09/2014  . Recurrent sinus infections 08/09/2014  . Hypoglycemia 06/26/2014  . Depression 11/21/2013  . Benign microscopic hematuria 11/21/2013  . Hyperlipidemia 11/21/2013  . Vitamin D deficiency 11/21/2013  . Weight gain 11/21/2013    Past Surgical History:  Procedure Laterality Date  . ABLATION  2009  . TONSILLECTOMY     age 43  . TOOTH EXTRACTION  09/22/14   pt reported    OB History    Gravida Para Term Preterm AB  Living   4 3 3          SAB TAB Ectopic Multiple Live Births           3       Home Medications    Prior to Admission medications   Medication Sig Start Date End Date Taking? Authorizing Provider  sertraline (ZOLOFT) 100 MG tablet TAKE 1 TABLET BY MOUTH EVERY DAY 07/30/16  Yes Debbrah Alar, NP  dicyclomine (BENTYL) 20 MG tablet Take 1 tablet (20 mg total) by mouth 2 (two) times daily. 01/29/17   Ok Edwards, PA-C    Family History Family History  Problem Relation Age of Onset  . Arthritis Mother   . Cancer Mother        lung  . Hyperlipidemia Mother   . Heart disease Mother   . Diabetes Mother   . Tuberculosis Mother   . Fibrocystic breast disease Mother   . Cancer Father        prostate  . Tuberculosis Father   . Alcohol abuse Father   . Gout Father   . Heart disease Sister   . Stroke Brother   . Heart disease Brother     Social History Social History  Substance Use Topics  . Smoking status: Never Smoker  . Smokeless tobacco: Never Used  . Alcohol use No     Allergies  Contrast media [iodinated diagnostic agents]; Penicillins; and Sulfa antibiotics   Review of Systems Review of Systems  Reason unable to perform ROS: See HPI as above.     Physical Exam Triage Vital Signs ED Triage Vitals  Enc Vitals Group     BP 01/29/17 1106 (!) 132/48     Pulse Rate 01/29/17 1106 70     Resp 01/29/17 1106 16     Temp 01/29/17 1106 98.3 F (36.8 C)     Temp Source 01/29/17 1106 Oral     SpO2 01/29/17 1106 94 %     Weight 01/29/17 1106 205 lb (93 kg)     Height 01/29/17 1106 5\' 2"  (1.575 m)     Head Circumference --      Peak Flow --      Pain Score 01/29/17 1116 6     Pain Loc --      Pain Edu? --      Excl. in [place]? --    Orthostatic VS for the past 24 hrs:  BP- Lying Pulse- Lying BP- Sitting Pulse- Sitting BP- Standing at 0 minutes Pulse- Standing at 0 minutes  01/29/17 1237 107/73 58 109/73 66 106/67 74    Updated Vital Signs BP (!) 132/48 (BP  Location: Left Arm)   Pulse 70   Temp 98.3 F (36.8 C) (Oral)   Resp 16   Ht 5\' 2"  (1.575 m)   Wt 205 lb (93 kg)   SpO2 94%   BMI 37.49 kg/m       Physical Exam  Constitutional: She is oriented to person, place, and time. She appears well-developed and well-nourished. No distress.  HENT:  Head: Normocephalic and atraumatic.  Cardiovascular: Normal rate, regular rhythm and normal heart sounds.  Exam reveals no gallop and no friction rub.   No murmur heard. Pulmonary/Chest: Effort normal and breath sounds normal. She has no wheezes. She has no rales.  Abdominal: Soft. Bowel sounds are normal. She exhibits no mass. There is tenderness (generlized tenderness). There is no rebound and no guarding.  Neurological: She is alert and oriented to person, place, and time. She has normal strength. No cranial nerve deficit or sensory deficit. She displays a negative Romberg sign.  Skin: Skin is warm and dry.     UC Treatments / Results  Labs (all labs ordered are listed, but only abnormal results are displayed) Labs Reviewed  GASTROINTESTINAL PANEL BY PCR, STOOL (REPLACES STOOL CULTURE)    EKG  EKG Interpretation None       Radiology No results found.  Procedures Procedures (including critical care time)  Medications Ordered in UC Medications - No data to display   Initial Impression / Assessment and Plan / UC Course  I have reviewed the triage vital signs and the nursing notes.  Pertinent labs & imaging results that were available during my care of the patient were reviewed by me and considered in my medical decision making (see chart for details).    Discussed with patient dizziness could be due to dehydration, as patient is only symptomatic with position change. Patient with CMP on 01/19/17 without significant findings. Patient has appointment with gastroenterology, will refill bentyl as it helps with patient's symptoms, and patient to follow up with GI as scheduled.  Return precautions given.   Final Clinical Impressions(s) / UC Diagnoses   Final diagnoses:  Diarrhea, unspecified type  Dehydration    New Prescriptions New Prescriptions   DICYCLOMINE (BENTYL) 20 MG TABLET  Take 1 tablet (20 mg total) by mouth 2 (two) times daily.      Ok Edwards, PA-C 01/29/17 2206

## 2017-01-29 NOTE — Discharge Instructions (Signed)
I have refilled bentyl to help with stomach upset. Keep hydrated, your urine should be clear to pale yellow in color. Monitor for worsening of symptoms, blood in stool/urine/vomit, chest pain, shortness of breath, increased abdominal pain, to go to the ED for further evaluation. Follow up with GI specialist for further evaluation.

## 2017-02-12 ENCOUNTER — Telehealth: Payer: Self-pay | Admitting: Family

## 2017-02-12 NOTE — Telephone Encounter (Signed)
Relation to ST:MHDQ Call back [phone] Pharmacy: [id number] - [place], [place]    Reason for call:    Patient requesting a refill sertraline (ZOLOFT) 100 MG tablet, patient completely out, informed patient in the future please call 48 hours in advance

## 2017-02-13 MED ORDER — SERTRALINE HCL 100 MG PO TABS: 100.0000 mg | ORAL_TABLET | Freq: Every day | ORAL | 5 refills | Status: DC

## 2017-02-13 NOTE — Telephone Encounter (Signed)
Rx sent to pharmacy   

## 2017-02-16 ENCOUNTER — Ambulatory Visit: Payer: Self-pay | Admitting: Family

## 2017-02-17 ENCOUNTER — Other Ambulatory Visit: Payer: Self-pay

## 2017-02-17 ENCOUNTER — Ambulatory Visit (INDEPENDENT_AMBULATORY_CARE_PROVIDER_SITE_OTHER): Payer: BLUE CROSS/BLUE SHIELD | Admitting: Family

## 2017-02-17 ENCOUNTER — Encounter: Payer: Self-pay | Admitting: Family

## 2017-02-17 VITALS — BP 123/70 | HR 80 | Temp 98.7°F | Ht 62.5 in | Wt 202.0 lb

## 2017-02-17 DIAGNOSIS — R10821 Right upper quadrant rebound abdominal tenderness: Secondary | ICD-10-CM

## 2017-02-17 DIAGNOSIS — R197 Diarrhea, unspecified: Secondary | ICD-10-CM | POA: Diagnosis not present

## 2017-02-17 MED ORDER — SERTRALINE HCL 100 MG PO TABS: 100.0000 mg | ORAL_TABLET | Freq: Every day | ORAL | 5 refills | Status: DC

## 2017-02-17 NOTE — Progress Notes (Addendum)
Subjective:    Patient ID: Amanda Oconnor, female    DOB: April 20, 1956, 61 y.o.   MRN: [id number]  HPI  Reports CC of diarrhea. Began on 01/09/17.  Lasted 3 weeks.  Was seen in urgent care on the 3rd week.  Reports that diarrhea has resolved but having very soft stools, gas.  Having 1 very soft stool a day but no longer watery.  One episode of diarrhea 1 week ago.  Notes that fried foods worsen her diarrhea. Mild gives her GI upset/diarrhea. Having trouble eating raw vegetables- causes diarrhea.  Cooked veggies seem OK.  Eats yogurt daily.  She has an appointment with GI.   Review of Systems    see HPI  Past Medical History:  Diagnosis Date  . Allergy   . Benign microscopic hematuria    per pt she has had extensive w/u which was negative  . Depression   . Fibrocystic breast disease   . History of chicken pox   . Hyperlipidemia   . Vaginal Pap smear, abnormal    at age 61  . Vitamin D deficiency      Social History   Social History  . Marital status: Married    Spouse name: N/A  . Number of children: N/A  . Years of education: N/A   Occupational History  . Not on file.   Social History Main Topics  . Smoking status: Never Smoker  . Smokeless tobacco: Never Used  . Alcohol use No  . Drug use: No  . Sexual activity: Not on file   Other Topics Concern  . Not on file   Social History Narrative   3 children   5 grandchildren   Married second marriage.  Husband is in [place].   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 ABLATION  2009  . TONSILLECTOMY     age 58  . TOOTH EXTRACTION  09/22/14   pt reported    Family History  Problem Relation Age of Onset  . Arthritis Mother   . Cancer Mother        lung  . Hyperlipidemia Mother   . Heart disease Mother   . Diabetes Mother   . Tuberculosis Mother   . Fibrocystic breast  disease Mother   . Cancer Father        prostate  . Tuberculosis Father   . Alcohol abuse Father   . Gout Father   . Heart disease Sister   . Stroke Brother   . Heart disease Brother     Allergies  Allergen Reactions  . Contrast Media [Iodinated Diagnostic Agents] Shortness Of Breath  . Penicillins Hives  . Sulfa Antibiotics Rash    No current outpatient prescriptions on file prior to visit.   No current facility-administered medications on file prior to visit.     BP 123/70   Pulse 80   Temp 98.7 F (37.1 C) (Oral)   Ht 5' 2.5" (1.588 m)   Wt 202 lb (91.6 kg)   SpO2 98%   BMI 36.36 kg/m    Objective:   Physical Exam  Constitutional: She is oriented to person, place, and time. She appears well-developed and well-nourished.  Cardiovascular: Normal rate, regular rhythm and normal heart sounds.   No murmur heard. Pulmonary/Chest: Effort normal and breath sounds normal. No respiratory distress. She has  no wheezes.  Abdominal: Soft. Bowel sounds are normal. She exhibits no distension. There is no tenderness. There is no rebound.  Musculoskeletal: She exhibits no edema.  Neurological: She is alert and oriented to person, place, and time.  Psychiatric: She has a normal mood and affect. Her behavior is normal. Judgment and thought content normal.          Assessment & Plan:  Diarrhea- improved. She still has some intermittent symptoms consistent with IBS and/or possibly lactose intolerance.  I have advised the patient as follows:  Please try eliminating dairy completely from your diet. You can add an over the counter probiotic once daily. You will be contacted about scheduling your abdominal US. Please keep your upcoming appointment with GI.  Call if new/worsening symptoms.

## 2017-02-17 NOTE — Patient Instructions (Signed)
Please try eliminating dairy completely from your diet. You can add an over the counter probiotic once daily. You will be contacted about scheduling your abdominal US. Please keep your upcoming appointment with GI.  Call if new/worsening symptoms.

## 2017-02-18 ENCOUNTER — Ambulatory Visit (HOSPITAL_BASED_OUTPATIENT_CLINIC_OR_DEPARTMENT_OTHER): Payer: BLUE CROSS/BLUE SHIELD

## 2017-02-26 ENCOUNTER — Other Ambulatory Visit: Payer: Self-pay | Admitting: Family

## 2017-02-26 ENCOUNTER — Ambulatory Visit (HOSPITAL_BASED_OUTPATIENT_CLINIC_OR_DEPARTMENT_OTHER): Admission: RE | Admit: 2017-02-26 | Payer: BLUE CROSS/BLUE SHIELD | Source: Ambulatory Visit

## 2017-02-26 DIAGNOSIS — Z1231 Encounter for screening mammogram for malignant neoplasm of breast: Secondary | ICD-10-CM

## 2017-02-28 ENCOUNTER — Ambulatory Visit (HOSPITAL_BASED_OUTPATIENT_CLINIC_OR_DEPARTMENT_OTHER)
Admission: RE | Admit: 2017-02-28 | Discharge: 2017-02-28 | Disposition: A | Discharge status: Home / Self Care | Payer: BLUE CROSS/BLUE SHIELD | Source: Ambulatory Visit | Attending: Family | Admitting: Family

## 2017-02-28 DIAGNOSIS — N281 Cyst of kidney, acquired: Secondary | ICD-10-CM | POA: Insufficient documentation

## 2017-02-28 DIAGNOSIS — R10821 Right upper quadrant rebound abdominal tenderness: Secondary | ICD-10-CM | POA: Insufficient documentation

## 2017-03-02 ENCOUNTER — Telehealth: Payer: Self-pay | Admitting: Family

## 2017-03-02 NOTE — Telephone Encounter (Signed)
Left message for pt to return my call.

## 2017-03-02 NOTE — Telephone Encounter (Signed)
Please let pt know that US shows normal gallbladder.  Note is made of fatty liver- low fat diet, exercise, weight loss is best treatment for fatty liver.  Please let me know if symptoms worsen or do not continue to improve.

## 2017-03-03 NOTE — Telephone Encounter (Signed)
Left message for pt to check mychart acct. Message sent.

## 2017-03-12 ENCOUNTER — Ambulatory Visit (INDEPENDENT_AMBULATORY_CARE_PROVIDER_SITE_OTHER): Payer: BLUE CROSS/BLUE SHIELD | Admitting: Gastroenterology

## 2017-03-12 ENCOUNTER — Encounter: Payer: Self-pay | Admitting: Gastroenterology

## 2017-03-12 VITALS — BP 104/70 | HR 65 | Ht 62.5 in | Wt 203.0 lb

## 2017-03-12 DIAGNOSIS — R198 Other specified symptoms and signs involving the digestive system and abdomen: Secondary | ICD-10-CM

## 2017-03-12 DIAGNOSIS — R634 Abnormal weight loss: Secondary | ICD-10-CM | POA: Diagnosis not present

## 2017-03-12 DIAGNOSIS — R110 Nausea: Secondary | ICD-10-CM

## 2017-03-12 DIAGNOSIS — R1084 Generalized abdominal pain: Secondary | ICD-10-CM | POA: Diagnosis not present

## 2017-03-12 MED ORDER — NA SULFATE-K SULFATE-MG SULF 17.5-3.13-1.6 GM/177ML PO SOLN: 1.0000 | Freq: Once | ORAL | 0 refills | Status: AC

## 2017-03-12 NOTE — Progress Notes (Addendum)
[facility] Consult Note:  History: Amanda Oconnor 03/12/2017  Referring physician: Debbrah Alar, NP  Reason for consult/chief complaint: Abdominal Pain (pt reports several episodes of epigastric pain and diarrhea since 01/10/2017; the symptoms have mostly resolved but still has intermittent diarrhea; has altered diet to try to identify triggers)   Subjective  HPI:  This is a 61 year old woman referred by primary care for abdominal pain and altered bowel habits with weight loss. She traveled to [place] in late July, and reports that for a few days prior to that she had the slow onset of abdominal bloating with some intermittent cramps that were primarily bandlike in the upper abdomen. Shortly after returning from the trip the symptoms intensified, with associated nausea and anorexia with continued frequent loose nonbloody stools. She came to the [facility] urgent care on July 30 and again on August 9 with these symptoms. She was prescribed dicyclomine, which she took for a few days and thought it may have helped. She lost about 20 pounds over the first month of symptoms, because she was uncertain what to eat. It seems like almost anything would cause postprandial cramps and loose stool. There is a lot of upper abdominal "churning" and bloating. Symptoms are slowly improving over the last couple of weeks, and she thinks her weight loss has stopped. She had not had any travel prior to that [place] visit, and had had no new meds prior to that. She had been on hormone replacement from her gynecologist for about a year prior to that, and stopped it when these symptoms developed.  ROS:  Review of Systems  Constitutional: Negative for appetite change and unexpected weight change.  HENT: Negative for mouth sores and voice change.   Eyes: Negative for pain and redness.  Respiratory: Negative for cough and shortness of breath.   Cardiovascular: Negative for  chest pain and palpitations.  Genitourinary: Negative for dysuria and hematuria.  Musculoskeletal: Negative for arthralgias and myalgias.  Skin: Negative for pallor and rash.  Allergic/Immunologic: Positive for environmental allergies.  Neurological: Negative for weakness and headaches.  Hematological: Negative for adenopathy.  Psychiatric/Behavioral: Positive for dysphoric mood.     Past Medical History: Past Medical History:  Diagnosis Date  . Allergy   . Benign microscopic hematuria    per pt she has had extensive w/u which was negative  . Depression   . Fatty liver   . Fibrocystic breast disease   . History of chicken pox   . Hyperlipidemia   . Vaginal Pap smear, abnormal    at age 41  . Vitamin D deficiency      Past Surgical History: Past Surgical History:  Procedure Laterality Date  . ABLATION  2009  . TONSILLECTOMY     age 73  . TOOTH EXTRACTION  09/22/14   pt reported     Family History: Family History  Problem Relation Age of Onset  . Arthritis Mother   . Cancer Mother        lung  . Hyperlipidemia Mother   . Heart disease Mother   . Diabetes Mother   . Tuberculosis Mother   . Fibrocystic breast disease Mother   . Cancer Father        prostate  . Tuberculosis Father   . Alcohol abuse Father   . Gout Father   . Heart disease Sister   . Stroke Brother   . Heart disease Brother     Social History: Social History  Social History  . Marital status: Married    Spouse name: N/A  . Number of children: N/A  . Years of education: N/A   Social History Main Topics  . Smoking status: Never Smoker  . Smokeless tobacco: Never Used  . Alcohol use No  . Drug use: No  . Sexual activity: Not Asked   Other Topics Concern  . None   Social History Narrative   3 children   5 grandchildren   Married second marriage.  Husband is in [place].   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 Contrast Media [Iodinated Diagnostic Agents] Shortness Of Breath  . Penicillins Hives  . Sulfa Antibiotics Rash    Outpatient Meds: Current Outpatient Prescriptions  Medication Sig Dispense Refill  . sertraline (ZOLOFT) 100 MG tablet Take 1 tablet (100 mg total) by mouth daily. 30 tablet 5  . simvastatin (ZOCOR) 40 MG tablet     . Na Sulfate-K Sulfate-Mg Sulf 17.5-3.13-1.6 GM/180ML SOLN Take 1 kit by mouth once. 354 mL 0   No current facility-administered medications for this visit.       ___________________________________________________________________ Objective   Exam:  BP 104/70   Pulse 65   Ht 5' 2.5" (1.588 m)   Wt 203 lb (92.1 kg)   BMI 36.54 kg/m    General: this is a(n) Well-appearing woman   Eyes: sclera anicteric, no redness  ENT: oral mucosa moist without lesions, no cervical or supraclavicular lymphadenopathy, good dentition  CV: RRR without murmur, S1/S2, no JVD, no peripheral edema  Resp: clear to auscultation bilaterally, normal RR and effort noted  GI: soft, mild generalized tenderness, with active bowel sounds. No guarding or palpable organomegaly noted.  Skin; warm and dry, no rash or jaundice noted  Neuro: awake, alert and oriented x 3. Normal gross motor function and fluent speech  Labs:  CBC Latest Ref Rng & Units 01/19/2017 10/23/2015 08/09/2014  WBC 4.0 - 10.5 K/uL 7.5 8.0 6.6  Hemoglobin 12.0 - 15.0 g/dL 14.5 14.7 14.1  Hematocrit 36.0 - 46.0 % 43.3 43.3 41.9  Platelets 150 - 400 K/uL 274 291.0 261.0   CMP Latest Ref Rng & Units 01/19/2017 10/23/2015 08/09/2014  Glucose 65 - 99 mg/dL 90 115(H) -  BUN 6 - 20 mg/dL 9 15 -  Creatinine 0.44 - 1.00 mg/dL 0.75 0.76 -  Sodium 135 - 145 mmol/L 140 138 -  Potassium 3.5 - 5.1 mmol/L 3.3(L) 4.2 -  Chloride 101 - 111 mmol/L 104 102 -  CO2 22 - 32 mmol/L 28 30 -  Calcium 8.9 - 10.3 mg/dL 9.1 9.6 -  Total Protein 6.5 - 8.1 g/dL 7.3  7.4 7.3  Total Bilirubin 0.3 - 1.2 mg/dL 0.7 0.5 0.5  Alkaline Phos 38 - 126 U/L 59 56 58  AST 15 - 41 U/L 35 21 21  ALT 14 - 54 U/L 56(H) 31 31     Radiologic Studies:  None  Assessment: Encounter Diagnoses  Name Primary?  . Generalized abdominal pain Yes  . Abnormal loss of weight   . Nausea without vomiting   . Change in bowel function     The symptoms are somewhat difficult to characterize. It is difficult to tell if it is primarily upper or lower digestive, with symptoms of both. I wonder if she could've had a postinfectious syndrome. However, with the  substantial weight loss, I think other causes must be ruled out. She has no family history of GI malignancies or celiac sprue.  Plan:  EGD and colonoscopy, she is agreeable after discussion of the procedure and risks  The benefits and risks of the planned procedure were described in detail with the patient or (when appropriate) their health care proxy.  Risks were outlined as including, but not limited to, bleeding, infection, perforation, adverse medication reaction leading to cardiac or pulmonary decompensation, or pancreatitis (if ERCP).  The limitation of incomplete mucosal visualization was also discussed.  No guarantees or warranties were given.   Thank you for the courtesy of this consult.  Please call me with any questions or concerns.  Nelida Meuse III  CC: Debbrah Alar, NP

## 2017-03-12 NOTE — Patient Instructions (Signed)
If you are age 61 or older, your body mass index should be between 23-30. Your Body mass index is 36.54 kg/m. If this is out of the aforementioned range listed, please consider follow up with your Primary Care Provider.  If you are age 73 or younger, your body mass index should be between 19-25. Your Body mass index is 36.54 kg/m. If this is out of the aformentioned range listed, please consider follow up with your Primary Care Provider.   You have been scheduled for an endoscopy and colonoscopy. Please follow the written instructions given to you at your visit today. Please pick up your prep supplies at the pharmacy within the next 1-3 days. If you use inhalers (even only as needed), please bring them with you on the day of your procedure. Your physician has requested that you go to www.startemmi.com and enter the access code given to you at your visit today. This web site gives a general overview about your procedure. However, you should still follow specific instructions given to you by our office regarding your preparation for the procedure.  Thank you for choosing [facility]  Dr Wilfrid Lund III

## 2017-03-19 ENCOUNTER — Ambulatory Visit (HOSPITAL_BASED_OUTPATIENT_CLINIC_OR_DEPARTMENT_OTHER): Payer: Self-pay

## 2017-03-23 ENCOUNTER — Ambulatory Visit (HOSPITAL_BASED_OUTPATIENT_CLINIC_OR_DEPARTMENT_OTHER): Payer: Self-pay

## 2017-03-26 ENCOUNTER — Encounter: Payer: Self-pay | Admitting: Gastroenterology

## 2017-04-08 ENCOUNTER — Encounter: Payer: Self-pay | Admitting: Gastroenterology

## 2017-04-08 ENCOUNTER — Ambulatory Visit (AMBULATORY_SURGERY_CENTER): Payer: BLUE CROSS/BLUE SHIELD | Admitting: Gastroenterology

## 2017-04-08 VITALS — BP 107/68 | HR 56 | Temp 97.5°F | Resp 15 | Ht 62.0 in | Wt 203.0 lb

## 2017-04-08 DIAGNOSIS — R634 Abnormal weight loss: Secondary | ICD-10-CM | POA: Diagnosis not present

## 2017-04-08 DIAGNOSIS — R1084 Generalized abdominal pain: Secondary | ICD-10-CM

## 2017-04-08 DIAGNOSIS — K3189 Other diseases of stomach and duodenum: Secondary | ICD-10-CM | POA: Diagnosis not present

## 2017-04-08 DIAGNOSIS — D122 Benign neoplasm of ascending colon: Secondary | ICD-10-CM | POA: Diagnosis not present

## 2017-04-08 DIAGNOSIS — R194 Change in bowel habit: Secondary | ICD-10-CM | POA: Diagnosis not present

## 2017-04-08 MED ORDER — SODIUM CHLORIDE 0.9 % IV SOLN: 500.0000 mL | INTRAVENOUS | Status: DC

## 2017-04-08 NOTE — Op Note (Signed)
[place] Patient Name: Amanda Oconnor Procedure Date: 04/08/2017 2:28 PM MRN: [id number] Endoscopist: Mallie Mussel L. Loletha Carrow , MD Age: 61 Referring MD:  Date of Birth: 18-May-1956 Gender: Female Account #: [id number] Procedure:                Colonoscopy Indications:              Lower abdominal pain, Clinically significant                            diarrhea of unexplained origin, Weight loss,                            bloating Medicines:                Monitored Anesthesia Care Procedure:                Pre-Anesthesia Assessment:                           - Prior to the procedure, a History and Physical                            was performed, and patient medications and                            allergies were reviewed. The patient's tolerance of                            previous anesthesia was also reviewed. The risks                            and benefits of the procedure and the sedation                            options and risks were discussed with the patient.                            All questions were answered, and informed consent                            was obtained. Prior Anticoagulants: The patient has                            taken no previous anticoagulant or antiplatelet                            agents. ASA Grade Assessment: II - A patient with                            mild systemic disease. After reviewing the risks                            and benefits, the patient was deemed in  satisfactory condition to undergo the procedure.                           After obtaining informed consent, the colonoscope                            was passed under direct vision. Throughout the                            procedure, the patient's blood pressure, pulse, and                            oxygen saturations were monitored continuously. The                            Colonoscope was introduced through the anus and                advanced to the the terminal ileum. The colonoscopy                            was performed without difficulty. The patient                            tolerated the procedure well. The quality of the                            bowel preparation was excellent. The terminal                            ileum, ileocecal valve, appendiceal orifice, and                            rectum were photographed. The quality of the bowel                            preparation was evaluated using the BBPS [facility]                            Bowel Preparation Scale) with scores of: Right                            Colon = 3, Transverse Colon = 3 and Left Colon = 3                            (entire mucosa seen well with no residual staining,                            small fragments of stool or opaque liquid). The                            total BBPS score equals 9. The bowel preparation  used was SUPREP. Scope In: 2:42:38 PM Scope Out: 2:55:37 PM Scope Withdrawal Time: 0 hours 10 minutes 21 seconds  Total Procedure Duration: 0 hours 12 minutes 59 seconds  Findings:                 The perianal and digital rectal examinations were                            normal.                           The terminal ileum appeared normal.                           Normal mucosa was found in the entire colon.                            Biopsies for histology were taken with a cold                            forceps from the right colon and left colon for                            evaluation of microscopic colitis.                           Two sessile polyps were found in the ascending                            colon. The polyps were 2 mm in size. These polyps                            were removed with a cold biopsy forceps. Resection                            and retrieval were complete.                           The exam was otherwise without abnormality on                             direct and retroflexion views. Complications:            No immediate complications. Estimated Blood Loss:     Estimated blood loss was minimal. Impression:               - The examined portion of the ileum was normal.                           - Normal mucosa in the entire examined colon.                            Biopsied.                           - Two 2 mm polyps in the ascending colon,  removed                            with a cold biopsy forceps. Resected and retrieved.                           - The examination was otherwise normal on direct                            and retroflexion views.                           The symptoms are improved over the last few months,                            and the weight loss has ceased.                           If biopsies are all normal, this may be a                            post-infectious syndrome. Recommendation:           - Patient has a contact number available for                            emergencies. The signs and symptoms of potential                            delayed complications were discussed with the                            patient. Return to normal activities tomorrow.                            Written discharge instructions were provided to the                            patient.                           - Resume previous diet.                           - Continue present medications.                           - Await pathology results.                           - Repeat colonoscopy is recommended for                            surveillance. The colonoscopy date will be                            determined after pathology results from  today's                            exam become available for review. Denyce Harr L. Loletha Carrow, MD 04/08/2017 3:10:47 PM This report has been signed electronically.

## 2017-04-08 NOTE — Progress Notes (Signed)
Called to room to assist during endoscopic procedure.  Patient ID and intended procedure confirmed with present staff. Received instructions for my participation in the procedure from the performing physician.  

## 2017-04-08 NOTE — Patient Instructions (Signed)
YOU HAD AN ENDOSCOPIC PROCEDURE TODAY AT THE [facility]:   Refer to the procedure report that was given to you for any specific questions about what was found during the examination.  If the procedure report does not answer your questions, please call your gastroenterologist to clarify.  If you requested that your care partner not be given the details of your procedure findings, then the procedure report has been included in a sealed envelope for you to review at your convenience later.  YOU SHOULD EXPECT: Some feelings of bloating in the abdomen. Passage of more gas than usual.  Walking can help get rid of the air that was put into your GI tract during the procedure and reduce the bloating. If you had a lower endoscopy (such as a colonoscopy or flexible sigmoidoscopy) you may notice spotting of blood in your stool or on the toilet paper. If you underwent a bowel prep for your procedure, you may not have a normal bowel movement for a few days.  Please Note:  You might notice some irritation and congestion in your nose or some drainage.  This is from the oxygen used during your procedure.  There is no need for concern and it should clear up in a day or so.  SYMPTOMS TO REPORT IMMEDIATELY:   Following lower endoscopy (colonoscopy or flexible sigmoidoscopy):  Excessive amounts of blood in the stool  Significant tenderness or worsening of abdominal pains  Swelling of the abdomen that is new, acute  Fever of 100F or higher   Following upper endoscopy (EGD)  Vomiting of blood or coffee ground material  New chest pain or pain under the shoulder blades  Painful or persistently difficult swallowing  New shortness of breath  Fever of 100F or higher  Black, tarry-looking stools  For urgent or emergent issues, a gastroenterologist can be reached at any hour by calling [phone].   DIET:  We do recommend a small meal at first, but then you may proceed to your regular diet.  Drink  plenty of fluids but you should avoid alcoholic beverages for 24 hours.  ACTIVITY:  You should plan to take it easy for the rest of today and you should NOT DRIVE or use heavy machinery until tomorrow (because of the sedation medicines used during the test).    FOLLOW UP: Our staff will call the number listed on your records the next business day following your procedure to check on you and address any questions or concerns that you may have regarding the information given to you following your procedure. If we do not reach you, we will leave a message.  However, if you are feeling well and you are not experiencing any problems, there is no need to return our call.  We will assume that you have returned to your regular daily activities without incident.  If any biopsies were taken you will be contacted by phone or by letter within the next 1-3 weeks.  Please call us at [phone] if you have not heard about the biopsies in 3 weeks.    SIGNATURES/CONFIDENTIALITY: You and/or your care partner have signed paperwork which will be entered into your electronic medical record.  These signatures attest to the fact that that the information above on your After Visit Summary has been reviewed and is understood.  Full responsibility of the confidentiality of this discharge information lies with you and/or your care-partner.    Handout was given to your care partner on polyps.   You may resume your current medications today. Await biopsy results. Please call if any questions or concerns.   

## 2017-04-08 NOTE — Progress Notes (Signed)
No problems noted in the recovery room. maw 

## 2017-04-08 NOTE — Op Note (Signed)
[place] Patient Name: Amanda Oconnor Procedure Date: 04/08/2017 2:29 PM MRN: [id number] Endoscopist: [place]. Loletha Carrow , MD Age: 61 Referring MD:  Date of Birth: 1956/03/13 Gender: Female Account #: [id number] Procedure:                Upper GI endoscopy Indications:              Upper abdominal pain, Abdominal bloating, Diarrhea,                            Weight loss Medicines:                Monitored Anesthesia Care Procedure:                Pre-Anesthesia Assessment:                           - Prior to the procedure, a History and Physical                            was performed, and patient medications and                            allergies were reviewed. The patient's tolerance of                            previous anesthesia was also reviewed. The risks                            and benefits of the procedure and the sedation                            options and risks were discussed with the patient.                            All questions were answered, and informed consent                            was obtained. Prior Anticoagulants: The patient has                            taken no previous anticoagulant or antiplatelet                            agents. ASA Grade Assessment: II - A patient with                            mild systemic disease. After reviewing the risks                            and benefits, the patient was deemed in                            satisfactory condition to undergo the procedure.  After obtaining informed consent, the endoscope was                            passed under direct vision. Throughout the                            procedure, the patient's blood pressure, pulse, and                            oxygen saturations were monitored continuously. The                            Endoscope was introduced through the mouth, and                            advanced to the second part of duodenum. The  upper                            GI endoscopy was accomplished without difficulty.                            The patient tolerated the procedure well. Scope In: Scope Out: Findings:                 The larynx was normal.                           The esophagus was normal.                           The stomach was normal.                           The cardia and gastric fundus were normal on                            retroflexion.                           The examined duodenum was normal. Six biopsies for                            histology were taken with a cold forceps for                            evaluation of celiac disease. Complications:            No immediate complications. Estimated Blood Loss:     Estimated blood loss: none. Impression:               - Normal larynx.                           - Normal esophagus.                           - Normal stomach.                           -  Normal examined duodenum. Biopsied. Recommendation:           - Patient has a contact number available for                            emergencies. The signs and symptoms of potential                            delayed complications were discussed with the                            patient. Return to normal activities tomorrow.                            Written discharge instructions were provided to the                            patient.                           - Resume previous diet.                           - Continue present medications.                           - Await pathology results.                           - See the other procedure note for documentation of                            additional recommendations. Tristin Vandeusen L. Loletha Carrow, MD 04/08/2017 3:02:07 PM This report has been signed electronically.

## 2017-04-08 NOTE — Progress Notes (Signed)
Pt's states no medical or surgical changes since previsit or office visit. 

## 2017-04-08 NOTE — Progress Notes (Signed)
To PACU VSS. Report to [email or web address] 

## 2017-04-09 ENCOUNTER — Telehealth: Payer: Self-pay

## 2017-04-09 NOTE — Telephone Encounter (Signed)
  Follow up Call-  Call back number 04/08/2017  Post procedure Call Back phone  # [phone]  Permission to leave phone message Yes  Some recent data might be hidden     Patient questions:  Do you have a fever, pain , or abdominal swelling? No. Pain Score  0 *  Have you tolerated food without any problems? Yes.    Have you been able to return to your normal activities? Yes.    Do you have any questions about your discharge instructions: Diet   No. Medications  No. Follow up visit  No.  Do you have questions or concerns about your Care? No.  Actions: * If pain score is 4 or above: No action needed, pain <4.  No problems noted per pt. maw

## 2017-07-04 ENCOUNTER — Other Ambulatory Visit: Payer: Self-pay | Admitting: Family

## 2017-07-14 ENCOUNTER — Telehealth: Payer: Self-pay | Admitting: Family

## 2017-07-14 NOTE — Telephone Encounter (Signed)
Patient paperwork (Health Provider Screening Form was  Mailed on 07/14/2017.

## 2017-09-01 DIAGNOSIS — J111 Influenza due to unidentified influenza virus with other respiratory manifestations: Secondary | ICD-10-CM | POA: Diagnosis not present

## 2017-09-01 DIAGNOSIS — J988 Other specified respiratory disorders: Secondary | ICD-10-CM | POA: Diagnosis not present

## 2017-09-01 DIAGNOSIS — R6883 Chills (without fever): Secondary | ICD-10-CM | POA: Diagnosis not present

## 2017-12-17 ENCOUNTER — Emergency Department (HOSPITAL_COMMUNITY)
Admission: EM | Admit: 2017-12-17 | Discharge: 2017-12-17 | Disposition: A | Discharge status: Home / Self Care | Payer: BLUE CROSS/BLUE SHIELD | Attending: Emergency Medicine | Admitting: Emergency Medicine

## 2017-12-17 ENCOUNTER — Encounter (HOSPITAL_COMMUNITY): Payer: Self-pay | Admitting: Emergency Medicine

## 2017-12-17 ENCOUNTER — Emergency Department (HOSPITAL_COMMUNITY): Payer: BLUE CROSS/BLUE SHIELD

## 2017-12-17 DIAGNOSIS — R42 Dizziness and giddiness: Secondary | ICD-10-CM | POA: Insufficient documentation

## 2017-12-17 DIAGNOSIS — Z79899 Other long term (current) drug therapy: Secondary | ICD-10-CM | POA: Diagnosis not present

## 2017-12-17 DIAGNOSIS — R457 State of emotional shock and stress, unspecified: Secondary | ICD-10-CM | POA: Diagnosis not present

## 2017-12-17 DIAGNOSIS — R079 Chest pain, unspecified: Secondary | ICD-10-CM | POA: Diagnosis not present

## 2017-12-17 DIAGNOSIS — R202 Paresthesia of skin: Secondary | ICD-10-CM | POA: Diagnosis not present

## 2017-12-17 DIAGNOSIS — G4489 Other headache syndrome: Secondary | ICD-10-CM | POA: Diagnosis not present

## 2017-12-17 DIAGNOSIS — R0602 Shortness of breath: Secondary | ICD-10-CM | POA: Diagnosis not present

## 2017-12-17 LAB — BASIC METABOLIC PANEL
ANION GAP: 8 (ref 5–15)
BUN: 15 mg/dL (ref 8–23)
CO2: 26 mmol/L (ref 22–32)
Calcium: 9.1 mg/dL (ref 8.9–10.3)
Chloride: 107 mmol/L (ref 98–111)
Creatinine, Ser: 0.69 mg/dL (ref 0.44–1.00)
Glucose, Bld: 119 mg/dL — ABNORMAL HIGH (ref 70–99)
Potassium: 4 mmol/L (ref 3.5–5.1)
SODIUM: 141 mmol/L (ref 135–145)

## 2017-12-17 LAB — CBC
HCT: 43.2 % (ref 36.0–46.0)
HEMOGLOBIN: 14.5 g/dL (ref 12.0–15.0)
MCH: 30.4 pg (ref 26.0–34.0)
MCHC: 33.6 g/dL (ref 30.0–36.0)
MCV: 90.6 fL (ref 78.0–100.0)
Platelets: 224 10*3/uL (ref 150–400)
RBC: 4.77 MIL/uL (ref 3.87–5.11)
RDW: 12.7 % (ref 11.5–15.5)
WBC: 6.3 10*3/uL (ref 4.0–10.5)

## 2017-12-17 LAB — TROPONIN I

## 2017-12-17 LAB — D-DIMER, QUANTITATIVE (NOT AT ARMC): D DIMER QUANT: 0.42 ug{FEU}/mL (ref 0.00–0.50)

## 2017-12-17 NOTE — ED Notes (Signed)
Bed: [facility] Expected date:  Expected time:  Means of arrival:  Comments: 

## 2017-12-17 NOTE — ED Notes (Signed)
Patient transported to X-ray 

## 2017-12-17 NOTE — ED Triage Notes (Signed)
Pt adds that she got headache, felt SOB with chest pains and hand and leg went tingling.

## 2017-12-17 NOTE — ED Triage Notes (Signed)
Per [facility] pt comes from orthodontist office having work done when had trouble breathing with paresthesia. Dizzy when standing.   Vitals: 92% on room air, placed on nasal canula 98% on 2L O2.

## 2017-12-17 NOTE — ED Notes (Signed)
Pt ambulated around the room and stated she was feeling a little light headed. O2 dropped to 95 but came back up to 98 quickly. RN aware.

## 2017-12-17 NOTE — Discharge Instructions (Signed)
Stay hydrated.   Drink plenty of fluids, including ensure.   See your doctor for follow up   Return to ER if you have worse shortness of breath, chest pain, dizziness, passing out.

## 2017-12-17 NOTE — ED Provider Notes (Signed)
[facility] DEPT Provider Note   CSN: [id number] Arrival date & time: 12/17/17  1048     History   Chief Complaint Chief Complaint  Patient presents with  . Shortness of Breath  . Dizziness  . Headache    HPI Amanda Oconnor is a 62 y.o. female history of fibrocystic breast disease, depression, fatty liver, hyperlipidemia here presenting with acute onset of shortness of breath.  Patient was at her orthodontist today to adjust her braces.  She states that while laying back, she had acute onset of shortness of breath and anxiety.  She states that she has not been sleeping well last few nights due to anxiety.  She denies any chest pain.  She denies any recent travel history of blood clots.  She states that may be she has some panic attacks.  Patient was noted to have a oxygen level of 92% and is not on oxygen at home.  Patient denies any history of COPD and is not a smoker.   The history is provided by the patient.    Past Medical History:  Diagnosis Date  . Allergy   . Benign microscopic hematuria    per pt she has had extensive w/u which was negative  . Depression   . Fatty liver   . Fibrocystic breast disease   . History of chicken pox   . Hyperlipidemia   . Vaginal Pap smear, abnormal    at age 60  . Vitamin D deficiency     Patient Active Problem List   Diagnosis Date Noted  . Sinusitis 08/06/2015  . Hyperglycemia 06/12/2015  . Osteopenia 06/01/2015  . Heme positive stool 08/25/2014  . Preventative health care 08/09/2014  . Recurrent sinus infections 08/09/2014  . Hypoglycemia 06/26/2014  . Depression 11/21/2013  . Benign microscopic hematuria 11/21/2013  . Hyperlipidemia 11/21/2013  . Vitamin D deficiency 11/21/2013  . Weight gain 11/21/2013    Past Surgical History:  Procedure Laterality Date  . ABLATION  2009  . TONSILLECTOMY     age 69  . TOOTH EXTRACTION  09/22/14   pt reported     OB History    Gravida  4   Para  3     Term  3   Preterm      AB      Living        SAB      TAB      Ectopic      Multiple      Live Births  3            Home Medications    Prior to Admission medications   Medication Sig Start Date End Date Taking? Authorizing Provider  sertraline (ZOLOFT) 100 MG tablet Take 1 tablet (100 mg total) by mouth daily. 02/17/17  Yes Debbrah Alar, NP  simvastatin (ZOCOR) 40 MG tablet TAKE 1 TABLET (40 MG TOTAL) BY MOUTH AT BEDTIME (MAX ON INS) 07/04/17  Yes Debbrah Alar, NP    Family History Family History  Problem Relation Age of Onset  . Arthritis Mother   . Cancer Mother        lung  . Hyperlipidemia Mother   . Heart disease Mother   . Diabetes Mother   . Tuberculosis Mother   . Fibrocystic breast disease Mother   . Cancer Father        prostate  . Tuberculosis Father   . Alcohol abuse Father   . Gout Father   .  Heart disease Sister   . Stroke Brother   . Heart disease Brother   . Ulcerative colitis Neg Hx   . Stomach cancer Neg Hx   . Colon cancer Neg Hx   . Esophageal cancer Neg Hx     Social History Social History   Tobacco Use  . Smoking status: Never Smoker  . Smokeless tobacco: Never Used  Substance Use Topics  . Alcohol use: No  . Drug use: No     Allergies   Contrast media [iodinated diagnostic agents]; Penicillins; and Sulfa antibiotics   Review of Systems Review of Systems  Respiratory: Positive for shortness of breath.   Neurological: Positive for dizziness and headaches.  All other systems reviewed and are negative.    Physical Exam Updated Vital Signs BP 110/73 (BP Location: Left Arm)   Pulse (!) 57   Resp 14   SpO2 98%   Physical Exam  Constitutional: She is oriented to person, place, and time. She appears well-developed and well-nourished.  HENT:  Head: Normocephalic.  Mouth/Throat: Oropharynx is clear and moist.  Eyes: Pupils are equal, round, and reactive to light. EOM are normal.  Neck: Normal  range of motion.  Cardiovascular: Normal rate.  Pulmonary/Chest: Effort normal and breath sounds normal.  Abdominal: Soft. Bowel sounds are normal.  Musculoskeletal: Normal range of motion.       Right lower leg: Normal. She exhibits no tenderness and no edema.       Left lower leg: Normal. She exhibits no tenderness and no edema.  Neurological: She is alert and oriented to person, place, and time.  Skin: Skin is warm.  Psychiatric: She has a normal mood and affect. Her behavior is normal.  Nursing note and vitals reviewed.    ED Treatments / Results  Labs (all labs ordered are listed, but only abnormal results are displayed) Labs Reviewed  BASIC METABOLIC PANEL - Abnormal; Notable for the following components:      Result Value   Glucose, Bld 119 (*)    All other components within normal limits  CBC  TROPONIN I  D-DIMER, QUANTITATIVE (NOT AT [facility])  I-STAT TROPONIN, ED    EKG EKG Interpretation  Date/Time:  Thursday December 17 2017 11:12:25 EDT Ventricular Rate:  63 PR Interval:    QRS Duration: 101 QT Interval:  443 QTC Calculation: 381 R Axis:   72 Text Interpretation:  Sinus rhythm Nonspecific T abnormalities, anterior leads No previous ECGs available Confirmed by Wandra Arthurs ([id number]) on 12/17/2017 11:34:10 AM   Radiology Dg Chest 2 View  Result Date: 12/17/2017 CLINICAL DATA:  Chest pain.  Nonsmoker. EXAM: CHEST - 2 VIEW COMPARISON:  None. FINDINGS: The heart size and mediastinal contours are within normal limits. Both lungs are clear. The visualized skeletal structures are unremarkable. IMPRESSION: No active cardiopulmonary disease. Electronically Signed   By: Staci Righter M.D.   On: 12/17/2017 11:30    Procedures Procedures (including critical care time)  Medications Ordered in ED Medications - No data to display   Initial Impression / Assessment and Plan / ED Course  I have reviewed the triage vital signs and the nursing notes.  Pertinent labs & imaging  results that were available during my care of the patient were reviewed by me and considered in my medical decision making (see chart for details).     Amanda Oconnor is a 62 y.o. female here with SOB. O2 92% on RA. Not a smoker, no hx of COPD, no wheezing  on exam. Consider panic attack vs PE. I doubt ACS. Will get labs, d-dimer, CXR, trop x 1.    12:57 PM Labs including D-dimer, trop neg. CXR clear. Ambulated and O2 stayed around 95-98% on RA. No wheezing on exam. Stable for discharge.    Final Clinical Impressions(s) / ED Diagnoses   Final diagnoses:  None    ED Discharge Orders    None       Drenda Freeze, MD 12/17/17 1258

## 2018-01-24 NOTE — Progress Notes (Deleted)
   Subjective:    Patient ID: Charee Tumblin, female    DOB: 10/07/1955, 62 y.o.   MRN: [id number]  HPI  Patient is a 62 yr old female who presents today for follow up.  1) Hyperlipidemia- patient is maintained on simvastatin.  Lab Results  Component Value Date   CHOL 161 05/07/2016   HDL 43.50 05/07/2016   LDLCALC 99 05/07/2016   TRIG 94.0 05/07/2016   CHOLHDL 4 05/07/2016   2) Depression- maintained on zoloft.      Review of Systems     Objective:   Physical Exam        Assessment & Plan:

## 2018-01-25 ENCOUNTER — Ambulatory Visit: Payer: Self-pay | Admitting: Family

## 2018-01-25 DIAGNOSIS — Z0289 Encounter for other administrative examinations: Secondary | ICD-10-CM

## 2018-02-05 ENCOUNTER — Encounter: Payer: Self-pay | Admitting: Family

## 2018-02-05 ENCOUNTER — Ambulatory Visit (INDEPENDENT_AMBULATORY_CARE_PROVIDER_SITE_OTHER): Payer: BLUE CROSS/BLUE SHIELD | Admitting: Family

## 2018-02-05 VITALS — BP 126/72 | HR 60 | Temp 98.3°F | Resp 16 | Ht 62.5 in | Wt 217.6 lb

## 2018-02-05 DIAGNOSIS — N952 Postmenopausal atrophic vaginitis: Secondary | ICD-10-CM

## 2018-02-05 DIAGNOSIS — F32A Depression, unspecified: Secondary | ICD-10-CM

## 2018-02-05 DIAGNOSIS — S99929A Unspecified injury of unspecified foot, initial encounter: Secondary | ICD-10-CM

## 2018-02-05 DIAGNOSIS — E785 Hyperlipidemia, unspecified: Secondary | ICD-10-CM | POA: Diagnosis not present

## 2018-02-05 DIAGNOSIS — F329 Major depressive disorder, single episode, unspecified: Secondary | ICD-10-CM | POA: Diagnosis not present

## 2018-02-05 MED ORDER — ESTRADIOL 0.1 MG/GM VA CREA: TOPICAL_CREAM | VAGINAL | 11 refills | Status: DC

## 2018-02-05 MED FILL — ESTRADIOL 0.1 MG/GM CREA: 0.1 | 30 days supply | Qty: 43 | Fill #0

## 2018-02-05 NOTE — Patient Instructions (Signed)
Please begin estrace cream. Please consider establishing with a counselor.

## 2018-02-05 NOTE — Progress Notes (Signed)
Subjective:    Patient ID: Amanda Oconnor, female    DOB: [birth year], 62 y.o.   MRN: [id number]  HPI  Patient is a 62 yr old female who presents today for follow up.  Depression- notes that she has had increased work stress. Has had multiple new managers.  Reports that she has been given difficult projects.   Hyperlipidemia- maintained on simvastatin.  Nail injury- notes that she jammed her left great toenail and it has started to lift.   She reports that she and her husband have been unable to have intercourse due to dyspareunia. She saw GYN previously and was given rx for estrace cream but could not get from the pharmacy at that time.   Review of Systems See HPI  Past Medical History:  Diagnosis Date  . Allergy   . Benign microscopic hematuria    per pt she has had extensive w/u which was negative  . Depression   . Fatty liver   . Fibrocystic breast disease   . History of chicken pox   . Hyperlipidemia   . Vaginal Pap smear, abnormal    at age 27  . Vitamin D deficiency      Social History   Socioeconomic History  . Marital status: Married    Spouse name: Not on file  . Number of children: Not on file  . Years of education: Not on file  . Highest education level: Not on file  Occupational History  . Not on file  Social Needs  . Financial resource strain: Not on file  . Food insecurity:    Worry: Not on file    Inability: Not on file  . Transportation needs:    Medical: Not on file    Non-medical: Not on file  Tobacco Use  . Smoking status: Never Smoker  . Smokeless tobacco: Never Used  Substance and Sexual Activity  . Alcohol use: No  . Drug use: No  . Sexual activity: Not on file  Lifestyle  . Physical activity:    Days per week: Not on file    Minutes per session: Not on file  . Stress: Not on file  Relationships  . Social connections:    Talks on phone: Not on file    Gets together: Not on file    Attends religious service: Not on file   Active member of club or organization: Not on file    Attends meetings of clubs or organizations: Not on file    Relationship status: Not on file  . Intimate partner violence:    Fear of current or ex partner: Not on file    Emotionally abused: Not on file    Physically abused: Not on file    Forced sexual activity: Not on file  Other Topics Concern  . Not on file  Social History Narrative   3 children   5 grandchildren   Married second marriage.  Husband is in [place].   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 ABLATION  2009  . TONSILLECTOMY     age 27  . TOOTH EXTRACTION  09/22/14   pt reported    Family History  Problem Relation Age of Onset  . Arthritis Mother   . Cancer Mother        lung  . Hyperlipidemia Mother   .  Heart disease Mother   . Diabetes Mother   . Tuberculosis Mother   . Fibrocystic breast disease Mother   . Cancer Father        prostate  . Tuberculosis Father   . Alcohol abuse Father   . Gout Father   . Heart disease Sister   . Stroke Brother   . Heart disease Brother   . Ulcerative colitis Neg Hx   . Stomach cancer Neg Hx   . Colon cancer Neg Hx   . Esophageal cancer Neg Hx     Allergies  Allergen Reactions  . Contrast Media [Iodinated Diagnostic Agents] Shortness Of Breath  . Penicillins Hives    Has patient had a PCN reaction causing immediate rash, facial/tongue/throat swelling, SOB or lightheadedness with hypotension: Yes Has patient had a PCN reaction causing severe rash involving mucus membranes or skin necrosis: Yes Has patient had a PCN reaction that required hospitalization: No Has patient had a PCN reaction occurring within the last 10 years: Unknown If all of the above answers are "NO", then may proceed with Cephalosporin use.   . Sulfa Antibiotics Rash    Current Outpatient  Medications on File Prior to Visit  Medication Sig Dispense Refill  . sertraline (ZOLOFT) 100 MG tablet Take 1 tablet (100 mg total) by mouth daily. 30 tablet 5  . simvastatin (ZOCOR) 40 MG tablet TAKE 1 TABLET (40 MG TOTAL) BY MOUTH AT BEDTIME (MAX ON INS) 90 tablet 1   No current facility-administered medications on file prior to visit.     BP 126/72 (BP Location: Right Arm, Cuff Size: Large)   Pulse 60   Temp 98.3 F (36.8 C) (Oral)   Resp 16   Ht 5' 2.5" (1.588 m)   Wt 217 lb 9.6 oz (98.7 kg)   SpO2 97%   BMI 39.17 kg/m       Objective:   Physical Exam  Constitutional: She is oriented to person, place, and time. She appears well-developed and well-nourished.  Cardiovascular: Normal rate, regular rhythm and normal heart sounds.  No murmur heard. Pulmonary/Chest: Effort normal and breath sounds normal. No respiratory distress. She has no wheezes.  Neurological: She is alert and oriented to person, place, and time.  Skin: Skin is warm and dry.  Left great toenail is slightly thickened and discolored  Psychiatric: Her behavior is normal. Judgment and thought content normal.  tearful          Assessment & Plan:  Atrophic vaginitis- rx with estrace cream.  Depression- seems largely situational. Suggested that she establish with a counselor and I gave her information about [facility].  Toenail injury-monitor. If no improvement in a few months could consider rx with lamisil for possible fungal infection.  Hyperlipidemia- tolerating statin. Plan lipid panel next visit.

## 2018-02-25 ENCOUNTER — Other Ambulatory Visit: Payer: Self-pay | Admitting: Family

## 2018-02-25 NOTE — Telephone Encounter (Signed)
Last refill on 02/17/2017  #30 with 5 refills--not sure ok to fill since has been a year since last refills.

## 2018-02-26 NOTE — Telephone Encounter (Signed)
Refill sent.

## 2018-03-08 ENCOUNTER — Ambulatory Visit (INDEPENDENT_AMBULATORY_CARE_PROVIDER_SITE_OTHER): Payer: BLUE CROSS/BLUE SHIELD | Admitting: Psychology

## 2018-03-08 DIAGNOSIS — F4323 Adjustment disorder with mixed anxiety and depressed mood: Secondary | ICD-10-CM

## 2018-03-09 ENCOUNTER — Other Ambulatory Visit: Payer: Self-pay | Admitting: Family

## 2018-03-29 ENCOUNTER — Ambulatory Visit (INDEPENDENT_AMBULATORY_CARE_PROVIDER_SITE_OTHER): Payer: BLUE CROSS/BLUE SHIELD | Admitting: Psychology

## 2018-03-29 DIAGNOSIS — F4323 Adjustment disorder with mixed anxiety and depressed mood: Secondary | ICD-10-CM

## 2018-05-03 ENCOUNTER — Ambulatory Visit: Payer: BLUE CROSS/BLUE SHIELD | Admitting: Family

## 2018-05-03 ENCOUNTER — Telehealth: Payer: Self-pay | Admitting: Family

## 2018-05-03 ENCOUNTER — Ambulatory Visit: Payer: BLUE CROSS/BLUE SHIELD | Admitting: Psychology

## 2018-05-03 DIAGNOSIS — Z1239 Encounter for other screening for malignant neoplasm of breast: Secondary | ICD-10-CM

## 2018-05-03 NOTE — Telephone Encounter (Signed)
Could you please replaced order for mammogram and remind pt to schedule?

## 2018-05-05 NOTE — Telephone Encounter (Signed)
Order re-entered. Pt was notified and transferred to radiology to schedule appt.

## 2018-05-07 ENCOUNTER — Encounter: Payer: Self-pay | Admitting: Family

## 2018-05-07 ENCOUNTER — Ambulatory Visit (INDEPENDENT_AMBULATORY_CARE_PROVIDER_SITE_OTHER): Payer: BLUE CROSS/BLUE SHIELD | Admitting: Family

## 2018-05-07 ENCOUNTER — Ambulatory Visit (HOSPITAL_BASED_OUTPATIENT_CLINIC_OR_DEPARTMENT_OTHER)
Admission: RE | Admit: 2018-05-07 | Discharge: 2018-05-07 | Disposition: A | Discharge status: Home / Self Care | Payer: BLUE CROSS/BLUE SHIELD | Source: Ambulatory Visit | Attending: Family | Admitting: Family

## 2018-05-07 VITALS — BP 136/68 | HR 85 | Temp 98.1°F | Resp 16 | Ht 63.0 in | Wt 218.2 lb

## 2018-05-07 DIAGNOSIS — Z1239 Encounter for other screening for malignant neoplasm of breast: Secondary | ICD-10-CM

## 2018-05-07 DIAGNOSIS — Z1231 Encounter for screening mammogram for malignant neoplasm of breast: Secondary | ICD-10-CM | POA: Diagnosis not present

## 2018-05-07 DIAGNOSIS — E785 Hyperlipidemia, unspecified: Secondary | ICD-10-CM | POA: Diagnosis not present

## 2018-05-07 DIAGNOSIS — F329 Major depressive disorder, single episode, unspecified: Secondary | ICD-10-CM

## 2018-05-07 DIAGNOSIS — N952 Postmenopausal atrophic vaginitis: Secondary | ICD-10-CM | POA: Diagnosis not present

## 2018-05-07 DIAGNOSIS — R739 Hyperglycemia, unspecified: Secondary | ICD-10-CM

## 2018-05-07 DIAGNOSIS — F32A Depression, unspecified: Secondary | ICD-10-CM

## 2018-05-07 MED ORDER — SIMVASTATIN 40 MG PO TABS: ORAL_TABLET | ORAL | 1 refills | Status: AC

## 2018-05-07 MED ORDER — SERTRALINE HCL 100 MG PO TABS: 100.0000 mg | ORAL_TABLET | Freq: Every day | ORAL | 1 refills | Status: DC

## 2018-05-07 NOTE — Progress Notes (Signed)
Subjective:    Patient ID: Amanda Oconnor, female    DOB: Jun 07, 1956, 62 y.o.   MRN: [id number]  HPI  Depression- reports that her work situation has improved- less stressful.  She is seeing Product/process development scientist for counseling.  She reports that she is been handling the stress better and has had less exposure to the coworker who have really been bothering her.  She continues Zoloft 100 mg daily.  Hyperlipidemia-maintained on statin. Lab Results  Component Value Date   CHOL 161 05/07/2016   HDL 43.50 05/07/2016   LDLCALC 99 05/07/2016   TRIG 94.0 05/07/2016   CHOLHDL 4 05/07/2016    Atrophic vaginitis- reports that she used it for 1 week.  Reports that she found it difficult to apply and quite messy.  She did not like it so she discontinued.    Review of Systems See HPI  Past Medical History:  Diagnosis Date  . Allergy   . Benign microscopic hematuria    per pt she has had extensive w/u which was negative  . Depression   . Fatty liver   . Fibrocystic breast disease   . History of chicken pox   . Hyperlipidemia   . Vaginal Pap smear, abnormal    at age 50  . Vitamin D deficiency      Social History   Socioeconomic History  . Marital status: Married    Spouse name: Not on file  . Number of children: Not on file  . Years of education: Not on file  . Highest education level: Not on file  Occupational History  . Not on file  Social Needs  . Financial resource strain: Not on file  . Food insecurity:    Worry: Not on file    Inability: Not on file  . Transportation needs:    Medical: Not on file    Non-medical: Not on file  Tobacco Use  . Smoking status: Never Smoker  . Smokeless tobacco: Never Used  Substance and Sexual Activity  . Alcohol use: No  . Drug use: No  . Sexual activity: Not on file  Lifestyle  . Physical activity:    Days per week: Not on file    Minutes per session: Not on file  . Stress: Not on file  Relationships  . Social connections:    Talks  on phone: Not on file    Gets together: Not on file    Attends religious service: Not on file    Active member of club or organization: Not on file    Attends meetings of clubs or organizations: Not on file    Relationship status: Not on file  . Intimate partner violence:    Fear of current or ex partner: Not on file    Emotionally abused: Not on file    Physically abused: Not on file    Forced sexual activity: Not on file  Other Topics Concern  . Not on file  Social History Narrative   3 children   5 grandchildren   Married second marriage.  Husband is in [place].   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 ABLATION  2009  . TONSILLECTOMY     age 45  . TOOTH EXTRACTION  09/22/14   pt reported    Family History  Problem Relation Age of Onset  .  Arthritis Mother   . Cancer Mother        lung  . Hyperlipidemia Mother   . Heart disease Mother   . Diabetes Mother   . Tuberculosis Mother   . Fibrocystic breast disease Mother   . Cancer Father        prostate  . Tuberculosis Father   . Alcohol abuse Father   . Gout Father   . Heart disease Sister   . Stroke Brother   . Heart disease Brother   . Ulcerative colitis Neg Hx   . Stomach cancer Neg Hx   . Colon cancer Neg Hx   . Esophageal cancer Neg Hx     Allergies  Allergen Reactions  . Contrast Media [Iodinated Diagnostic Agents] Shortness Of Breath  . Penicillins Hives    Has patient had a PCN reaction causing immediate rash, facial/tongue/throat swelling, SOB or lightheadedness with hypotension: Yes Has patient had a PCN reaction causing severe rash involving mucus membranes or skin necrosis: Yes Has patient had a PCN reaction that required hospitalization: No Has patient had a PCN reaction occurring within the last 10 years: Unknown If all of the above answers are "NO", then  may proceed with Cephalosporin use.   . Sulfa Antibiotics Rash    No current outpatient medications on file prior to visit.   No current facility-administered medications on file prior to visit.     BP 136/68 (BP Location: Right Arm, Patient Position: Sitting, Cuff Size: Large)   Pulse 85   Temp 98.1 F (36.7 C) (Oral)   Resp 16   Ht 5\' 3"  (1.6 m)   Wt 218 lb 3.2 oz (99 kg)   SpO2 96%   BMI 38.65 kg/m       Objective:   Physical Exam  Constitutional: She is oriented to person, place, and time. She appears well-developed and well-nourished.  Cardiovascular: Normal rate, regular rhythm and normal heart sounds.  No murmur heard. Pulmonary/Chest: Effort normal and breath sounds normal. No respiratory distress. She has no wheezes.  Neurological: She is alert and oriented to person, place, and time.  Skin: Skin is warm and dry.  Psychiatric: She has a normal mood and affect. Her behavior is normal. Judgment and thought content normal.          Assessment & Plan:  Hyperlipidemia-we will obtain follow-up lipid panel.  She wishes to schedule for another day when she is fasting.  Hyperglycemia-due for follow-up A1c.  Will obtain.  Depression-stable/improved.  Continue Zoloft and counseling.  Atrophic vaginitis-we will refer to gynecology.  She may be a candidate for the Estrace ring.  She is also due for Pap smear.

## 2018-05-12 ENCOUNTER — Telehealth: Payer: Self-pay | Admitting: Family

## 2018-05-12 DIAGNOSIS — R739 Hyperglycemia, unspecified: Secondary | ICD-10-CM

## 2018-05-12 NOTE — Telephone Encounter (Signed)
Left detailed message on voicemail to call and schedule lab appt to complete future lab orders. [facility] / triage to schedule with pt.

## 2018-05-12 NOTE — Telephone Encounter (Signed)
Needs lab visit please to complete lab work ordered at time of her visit.

## 2018-06-04 ENCOUNTER — Ambulatory Visit: Payer: BLUE CROSS/BLUE SHIELD | Admitting: Psychology

## 2018-07-23 IMAGING — US US ABDOMEN COMPLETE
1 series · 14 of 25 positions shown · non-contrast
Comparison: None.

CLINICAL DATA: Sporadic epigastric pain.  Diarrhea.

EXAM:
ABDOMEN ULTRASOUND COMPLETE

[Series 1: us abdomen complete · 0.18mm/px · 14 of 71 slices shown]
[im 1/71]
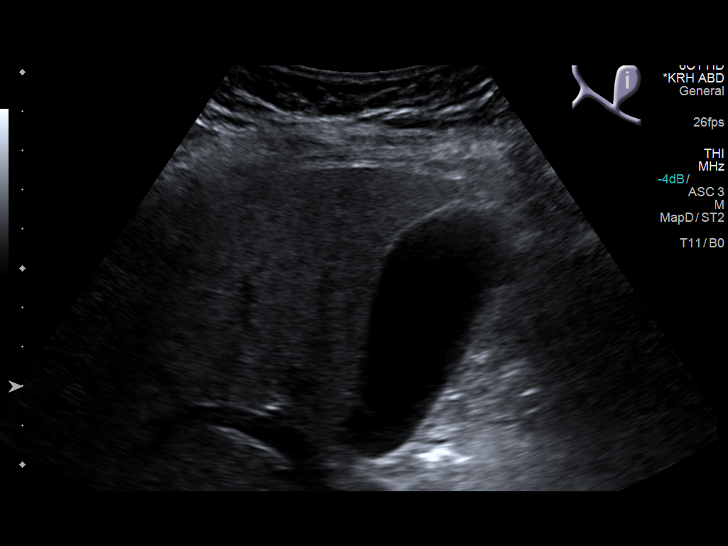
[im 6/71]
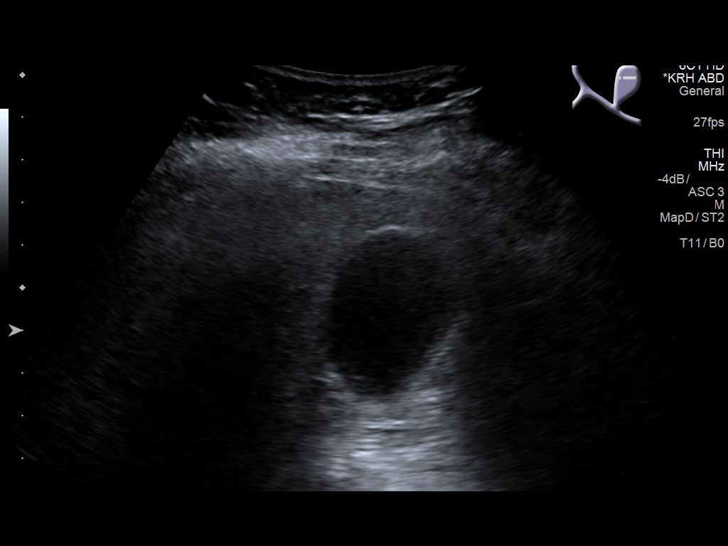
[im 12/71]
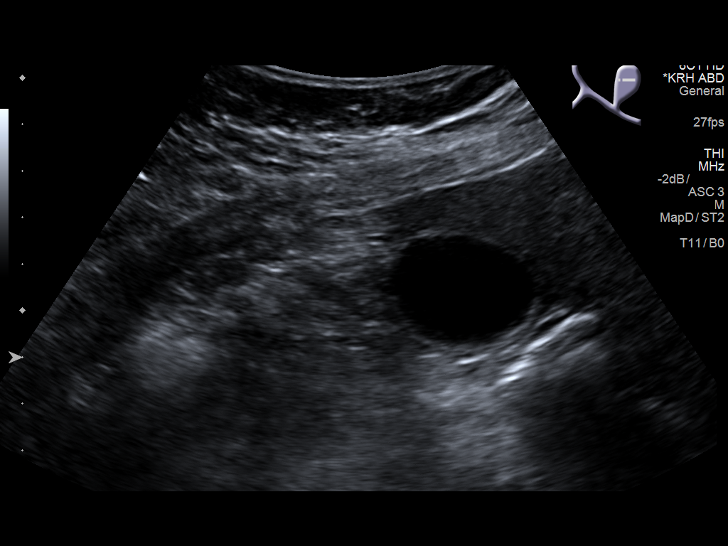
[im 18/71]
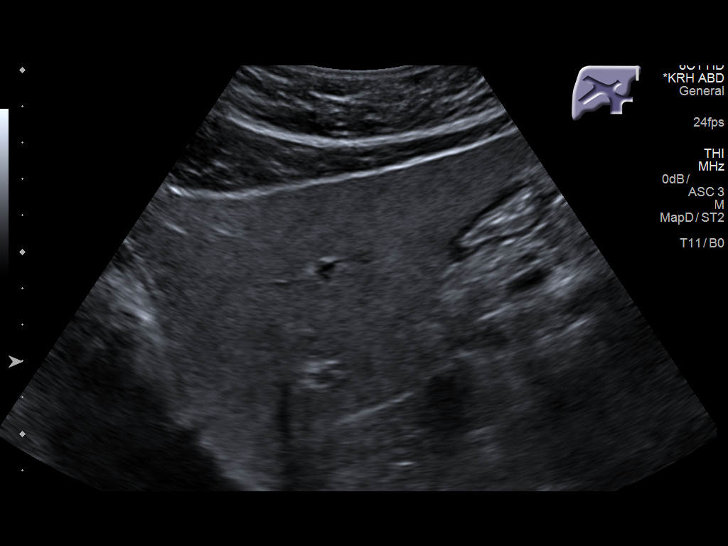
[im 24/71]
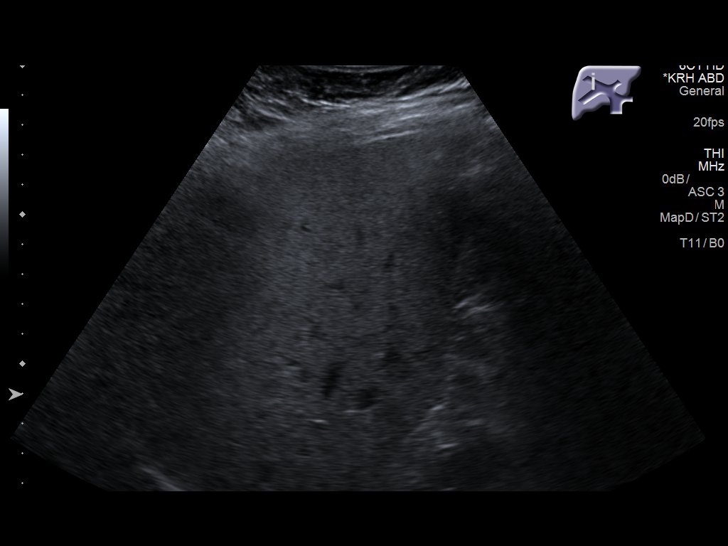
[im 27/71]
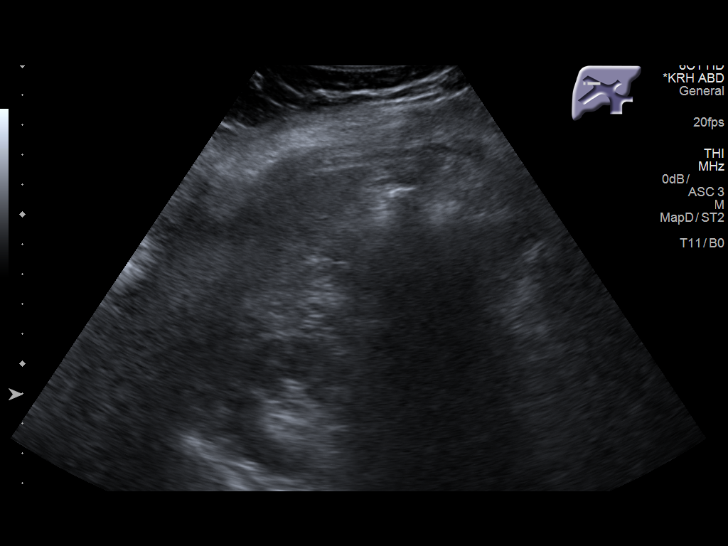
[im 33/71]
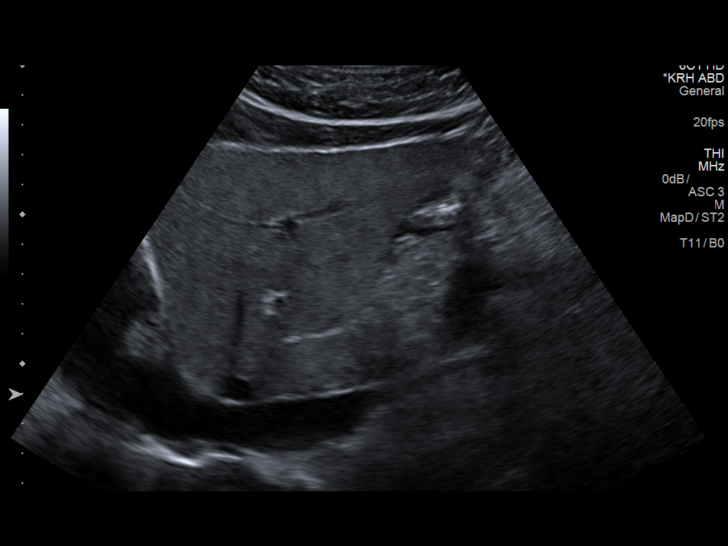
[im 38/71]
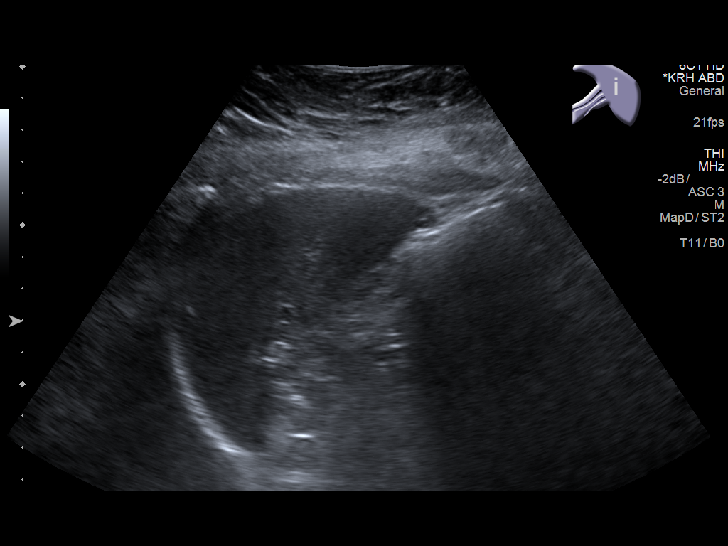
[im 44/71]
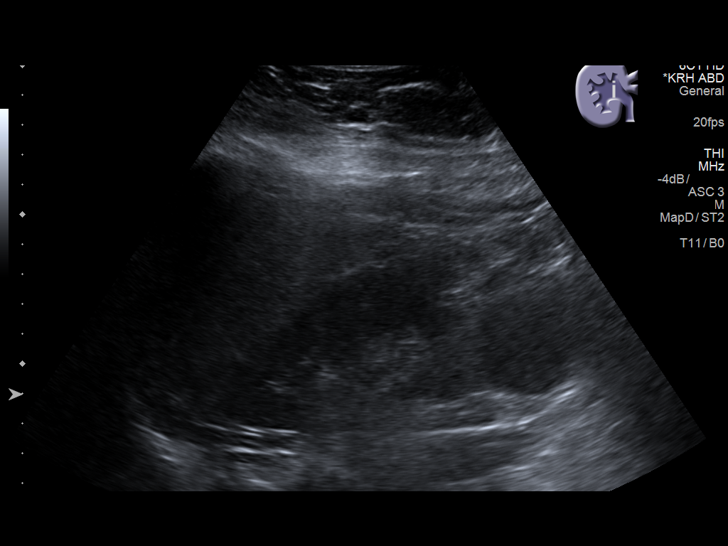
[im 47/71]
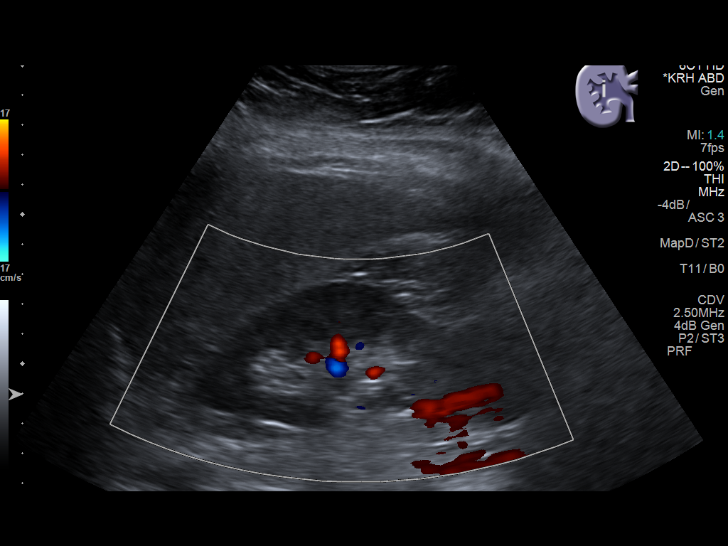
[im 53/71]
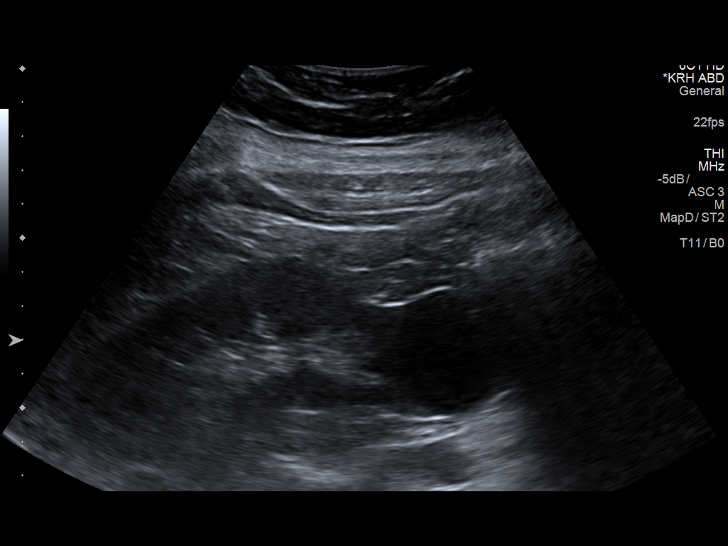
[im 59/71]
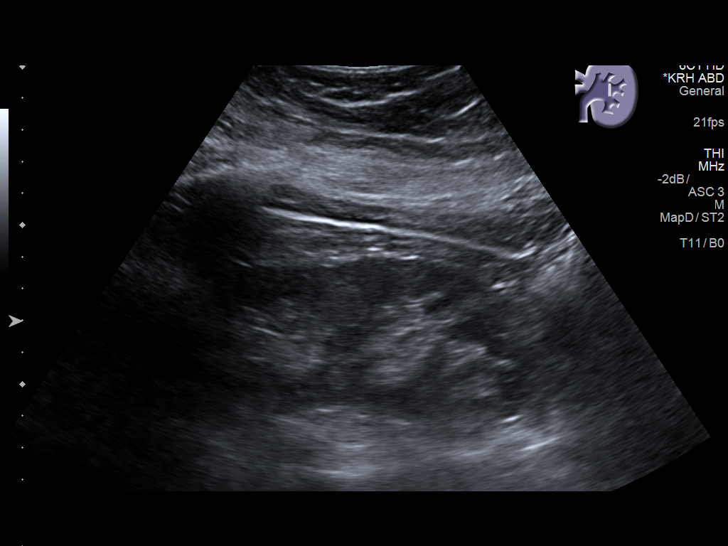
[im 65/71]
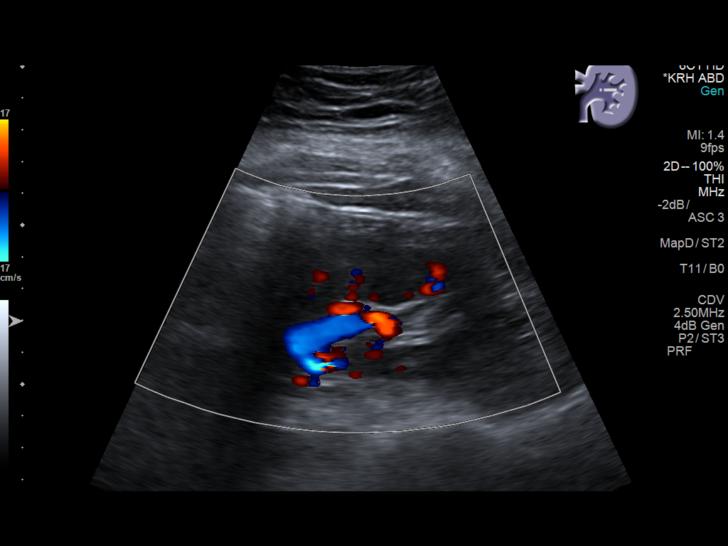
[im 71/71]
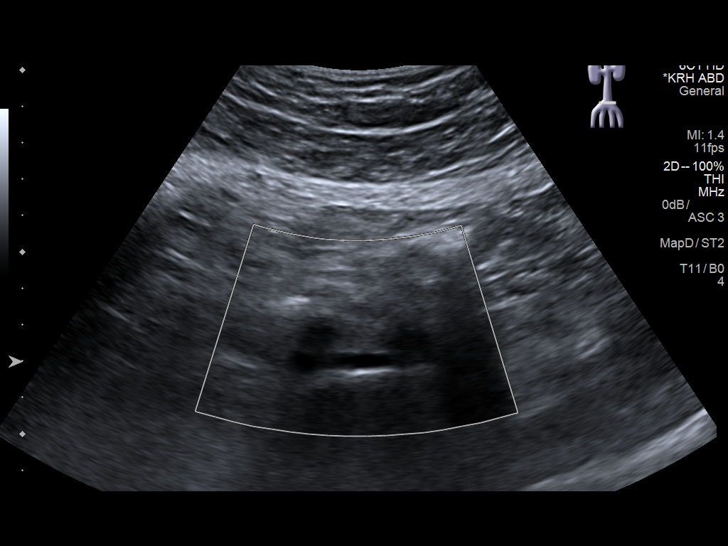

[14 of 25 positions shown; findings below may reference images not displayed]

FINDINGS: Gallbladder: No gallstones or wall thickening visualized. No
sonographic Murphy sign noted by sonographer.

Common bile duct: Diameter: 4 mm

Liver: Probable hepatic steatosis. No focal mass. Portal vein is
patent on color Doppler imaging with normal direction of blood flow
towards the liver.

IVC: No abnormality visualized.

Pancreas: Visualized portion unremarkable.

Spleen: Size and appearance within normal limits.

Right Kidney: Length: 8.9 cm.  3.7 cm cyst

Left Kidney: Length: 11.7 cm. Echogenicity within normal limits. No
mass or hydronephrosis visualized.

Abdominal aorta: No aneurysm visualized.

Other findings: None.
IMPRESSION: 1. No cause for pain identified.
2. Probable hepatic steatosis.
3. Right renal cyst.

## 2018-08-09 DIAGNOSIS — Z6841 Body Mass Index (BMI) 40.0 and over, adult: Secondary | ICD-10-CM | POA: Diagnosis not present

## 2018-08-09 DIAGNOSIS — Z1151 Encounter for screening for human papillomavirus (HPV): Secondary | ICD-10-CM | POA: Diagnosis not present

## 2018-08-09 DIAGNOSIS — Z01419 Encounter for gynecological examination (general) (routine) without abnormal findings: Secondary | ICD-10-CM | POA: Diagnosis not present

## 2018-08-09 DIAGNOSIS — R899 Unspecified abnormal finding in specimens from other organs, systems and tissues: Secondary | ICD-10-CM | POA: Diagnosis not present

## 2018-08-09 DIAGNOSIS — Z13220 Encounter for screening for lipoid disorders: Secondary | ICD-10-CM | POA: Diagnosis not present

## 2018-08-09 DIAGNOSIS — Z0000 Encounter for general adult medical examination without abnormal findings: Secondary | ICD-10-CM | POA: Diagnosis not present

## 2018-08-09 DIAGNOSIS — Z113 Encounter for screening for infections with a predominantly sexual mode of transmission: Secondary | ICD-10-CM | POA: Diagnosis not present

## 2018-08-09 DIAGNOSIS — Z130 Encounter for screening for diseases of the blood and blood-forming organs and certain disorders involving the immune mechanism: Secondary | ICD-10-CM | POA: Diagnosis not present

## 2018-08-09 DIAGNOSIS — Z1329 Encounter for screening for other suspected endocrine disorder: Secondary | ICD-10-CM | POA: Diagnosis not present

## 2018-08-09 DIAGNOSIS — Z833 Family history of diabetes mellitus: Secondary | ICD-10-CM | POA: Diagnosis not present

## 2018-08-12 ENCOUNTER — Encounter: Payer: Self-pay | Admitting: Family

## 2018-08-12 ENCOUNTER — Ambulatory Visit (INDEPENDENT_AMBULATORY_CARE_PROVIDER_SITE_OTHER): Payer: BLUE CROSS/BLUE SHIELD | Admitting: Family

## 2018-08-12 ENCOUNTER — Ambulatory Visit: Payer: BLUE CROSS/BLUE SHIELD | Admitting: Family

## 2018-08-12 VITALS — BP 127/71 | HR 65 | Temp 98.1°F | Resp 16 | Ht 62.5 in | Wt 221.2 lb

## 2018-08-12 DIAGNOSIS — J329 Chronic sinusitis, unspecified: Secondary | ICD-10-CM

## 2018-08-12 DIAGNOSIS — E785 Hyperlipidemia, unspecified: Secondary | ICD-10-CM | POA: Diagnosis not present

## 2018-08-12 DIAGNOSIS — R739 Hyperglycemia, unspecified: Secondary | ICD-10-CM | POA: Diagnosis not present

## 2018-08-12 LAB — HEMOGLOBIN A1C: HEMOGLOBIN A1C: 6.2 % (ref 4.6–6.5)

## 2018-08-12 MED ORDER — AZITHROMYCIN 250 MG PO TABS: ORAL_TABLET | ORAL | 0 refills | Status: DC

## 2018-08-12 NOTE — Progress Notes (Signed)
Subjective:    Patient ID: Amanda Oconnor, female    DOB: Jan 25, 1956, 63 y.o.   MRN: [id number]  HPI  Patient is a 63 year old female who presents today for follow-up. She reports that her GYN called her to tell her that she was "1 point away from being a diabetic."  Also told her that her cholesterol is very high. Was told to follow up with pcp.   Hypertension-she is not currently on antihypertensive. BP Readings from Last 3 Encounters:  08/12/18 127/71  05/07/18 136/68  02/05/18 126/72   R ear discomfort-reports some pressure in her right ear. Also reports 1 month history of green nasal drainage and sinus congestion.   Hyperlipidemia-she reports that she lost her meds and had not taken statin in a while.  Lab Results  Component Value Date   CHOL 161 05/07/2016   HDL 43.50 05/07/2016   LDLCALC 99 05/07/2016   TRIG 94.0 05/07/2016   CHOLHDL 4 05/07/2016    Review of Systems See HPI  Past Medical History:  Diagnosis Date  . Allergy   . Benign microscopic hematuria    per pt she has had extensive w/u which was negative  . Depression   . Fatty liver   . Fibrocystic breast disease   . History of chicken pox   . Hyperlipidemia   . Vaginal Pap smear, abnormal    at age 89  . Vitamin D deficiency      Social History   Socioeconomic History  . Marital status: Married    Spouse name: Not on file  . Number of children: Not on file  . Years of education: Not on file  . Highest education level: Not on file  Occupational History  . Not on file  Social Needs  . Financial resource strain: Not on file  . Food insecurity:    Worry: Not on file    Inability: Not on file  . Transportation needs:    Medical: Not on file    Non-medical: Not on file  Tobacco Use  . Smoking status: Never Smoker  . Smokeless tobacco: Never Used  Substance and Sexual Activity  . Alcohol use: No  . Drug use: No  . Sexual activity: Not on file  Lifestyle  . Physical activity:    Days per  week: Not on file    Minutes per session: Not on file  . Stress: Not on file  Relationships  . Social connections:    Talks on phone: Not on file    Gets together: Not on file    Attends religious service: Not on file    Active member of club or organization: Not on file    Attends meetings of clubs or organizations: Not on file    Relationship status: Not on file  . Intimate partner violence:    Fear of current or ex partner: Not on file    Emotionally abused: Not on file    Physically abused: Not on file    Forced sexual activity: Not on file  Other Topics Concern  . Not on file  Social History Narrative   3 children   5 grandchildren   Married second marriage.  Husband is in [place].   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 ABLATION  2009  . TONSILLECTOMY     age  12  . TOOTH EXTRACTION  09/22/14   pt reported    Family History  Problem Relation Age of Onset  . Arthritis Mother   . Cancer Mother        lung  . Hyperlipidemia Mother   . Heart disease Mother   . Diabetes Mother   . Tuberculosis Mother   . Fibrocystic breast disease Mother   . Cancer Father        prostate  . Tuberculosis Father   . Alcohol abuse Father   . Gout Father   . Heart disease Sister   . Stroke Brother   . Heart disease Brother   . Ulcerative colitis Neg Hx   . Stomach cancer Neg Hx   . Colon cancer Neg Hx   . Esophageal cancer Neg Hx     Allergies  Allergen Reactions  . Contrast Media [Iodinated Diagnostic Agents] Shortness Of Breath  . Penicillins Hives    Has patient had a PCN reaction causing immediate rash, facial/tongue/throat swelling, SOB or lightheadedness with hypotension: Yes Has patient had a PCN reaction causing severe rash involving mucus membranes or skin necrosis: Yes Has patient had a PCN reaction that required  hospitalization: No Has patient had a PCN reaction occurring within the last 10 years: Unknown If all of the above answers are "NO", then may proceed with Cephalosporin use.   . Sulfa Antibiotics Rash    Current Outpatient Medications on File Prior to Visit  Medication Sig Dispense Refill  . sertraline (ZOLOFT) 100 MG tablet Take 1 tablet (100 mg total) by mouth daily. 90 tablet 1  . simvastatin (ZOCOR) 40 MG tablet TAKE 1 TABLET (40 MG TOTAL) BY MOUTH AT BEDTIME (MAX ON INS) 90 tablet 1   No current facility-administered medications on file prior to visit.     BP 127/71 (BP Location: Right Arm, Patient Position: Sitting, Cuff Size: Large)   Pulse 65   Temp 98.1 F (36.7 C) (Oral)   Resp 16   Ht 5' 2.5" (1.588 m)   Wt 221 lb 3.2 oz (100.3 kg)   SpO2 97%   BMI 39.81 kg/m       Objective:   Physical Exam Constitutional:      Appearance: She is well-developed.  HENT:     Right Ear: Ear canal normal.     Left Ear: Ear canal normal.     Ears:     Comments: Bilateral TMs mildly retracted.     Mouth/Throat:     Pharynx: Posterior oropharyngeal erythema present.  Neck:     Musculoskeletal: Neck supple.     Thyroid: No thyromegaly.  Cardiovascular:     Rate and Rhythm: Normal rate and regular rhythm.     Heart sounds: Normal heart sounds. No murmur.  Pulmonary:     Effort: Pulmonary effort is normal. No respiratory distress.     Breath sounds: Normal breath sounds. No wheezing.  Skin:    General: Skin is warm and dry.  Neurological:     Mental Status: She is alert and oriented to person, place, and time.  Psychiatric:        Behavior: Behavior normal.        Thought Content: Thought content normal.        Judgment: Judgment normal.           Assessment & Plan:  Hyperglycemia- obtain follow up A1C.  Will request labs from GYN. Discussed low glycemic diet, exercise, weight loss.  Hyperlipidemia- uncontrolled.  Restart statin.  Sinusitis- rx with azithromycin,  advised pt to start flonase for sinus congestion and eustachian tube dysfunction.

## 2018-08-12 NOTE — Patient Instructions (Signed)
Please complete lab work prior to leaving. Restart simvastatin for cholesterol. Please work on healthy low fat/low sugar diet, exercise and weight loss.

## 2018-08-13 ENCOUNTER — Telehealth: Payer: Self-pay | Admitting: *Deleted

## 2018-08-13 NOTE — Telephone Encounter (Signed)
Received Lab Report results from [facility]; forwarded to provider/SLS 02/21

## 2018-08-23 ENCOUNTER — Other Ambulatory Visit: Payer: Self-pay

## 2018-08-23 ENCOUNTER — Telehealth: Payer: Self-pay | Admitting: Family

## 2018-08-23 DIAGNOSIS — E785 Hyperlipidemia, unspecified: Secondary | ICD-10-CM

## 2018-08-23 DIAGNOSIS — E039 Hypothyroidism, unspecified: Secondary | ICD-10-CM

## 2018-08-23 NOTE — Telephone Encounter (Signed)
All results given to patient with provider's advise. She will come in 4-07 for further testing.

## 2018-08-23 NOTE — Telephone Encounter (Signed)
Also, her cholesterol was super high and I would like her to restart simvastatin. We can repeat her lipid panel in 1 month.

## 2018-08-23 NOTE — Telephone Encounter (Signed)
Please contact pt and let her know that I reviewed the lab work from her GYN.  Lab work noted very mildly underactive thyroid.  I would recommend that she repeat thyroid function testing in 1 month.  I have pended the labs.

## 2018-09-23 IMAGING — CR DG CHEST 2V
2 series · 2 of 2 positions shown · non-contrast
Comparison: None.

CLINICAL DATA: Chest pain.  Nonsmoker.

EXAM:
CHEST - 2 VIEW

[w chest pa]
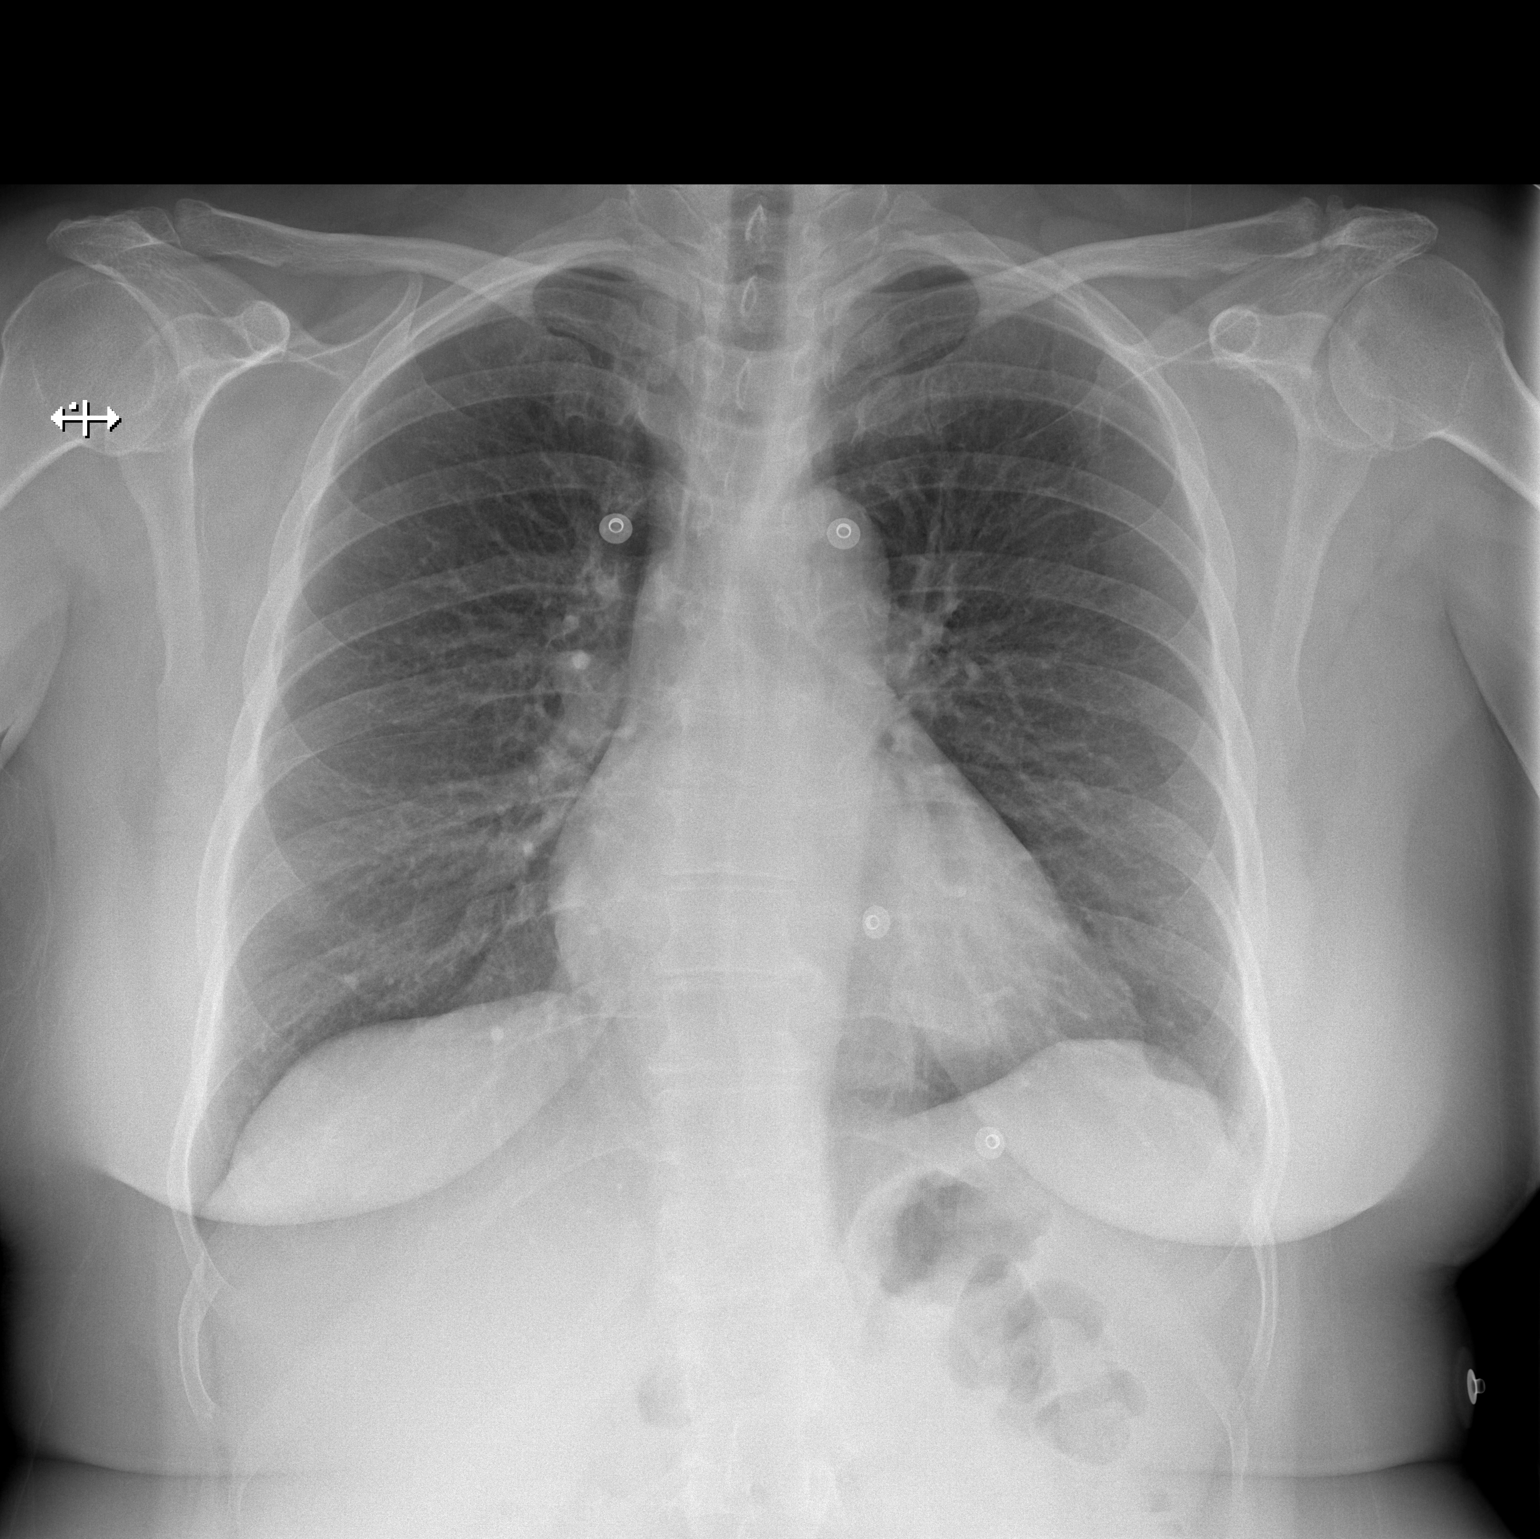

[w chest lat]
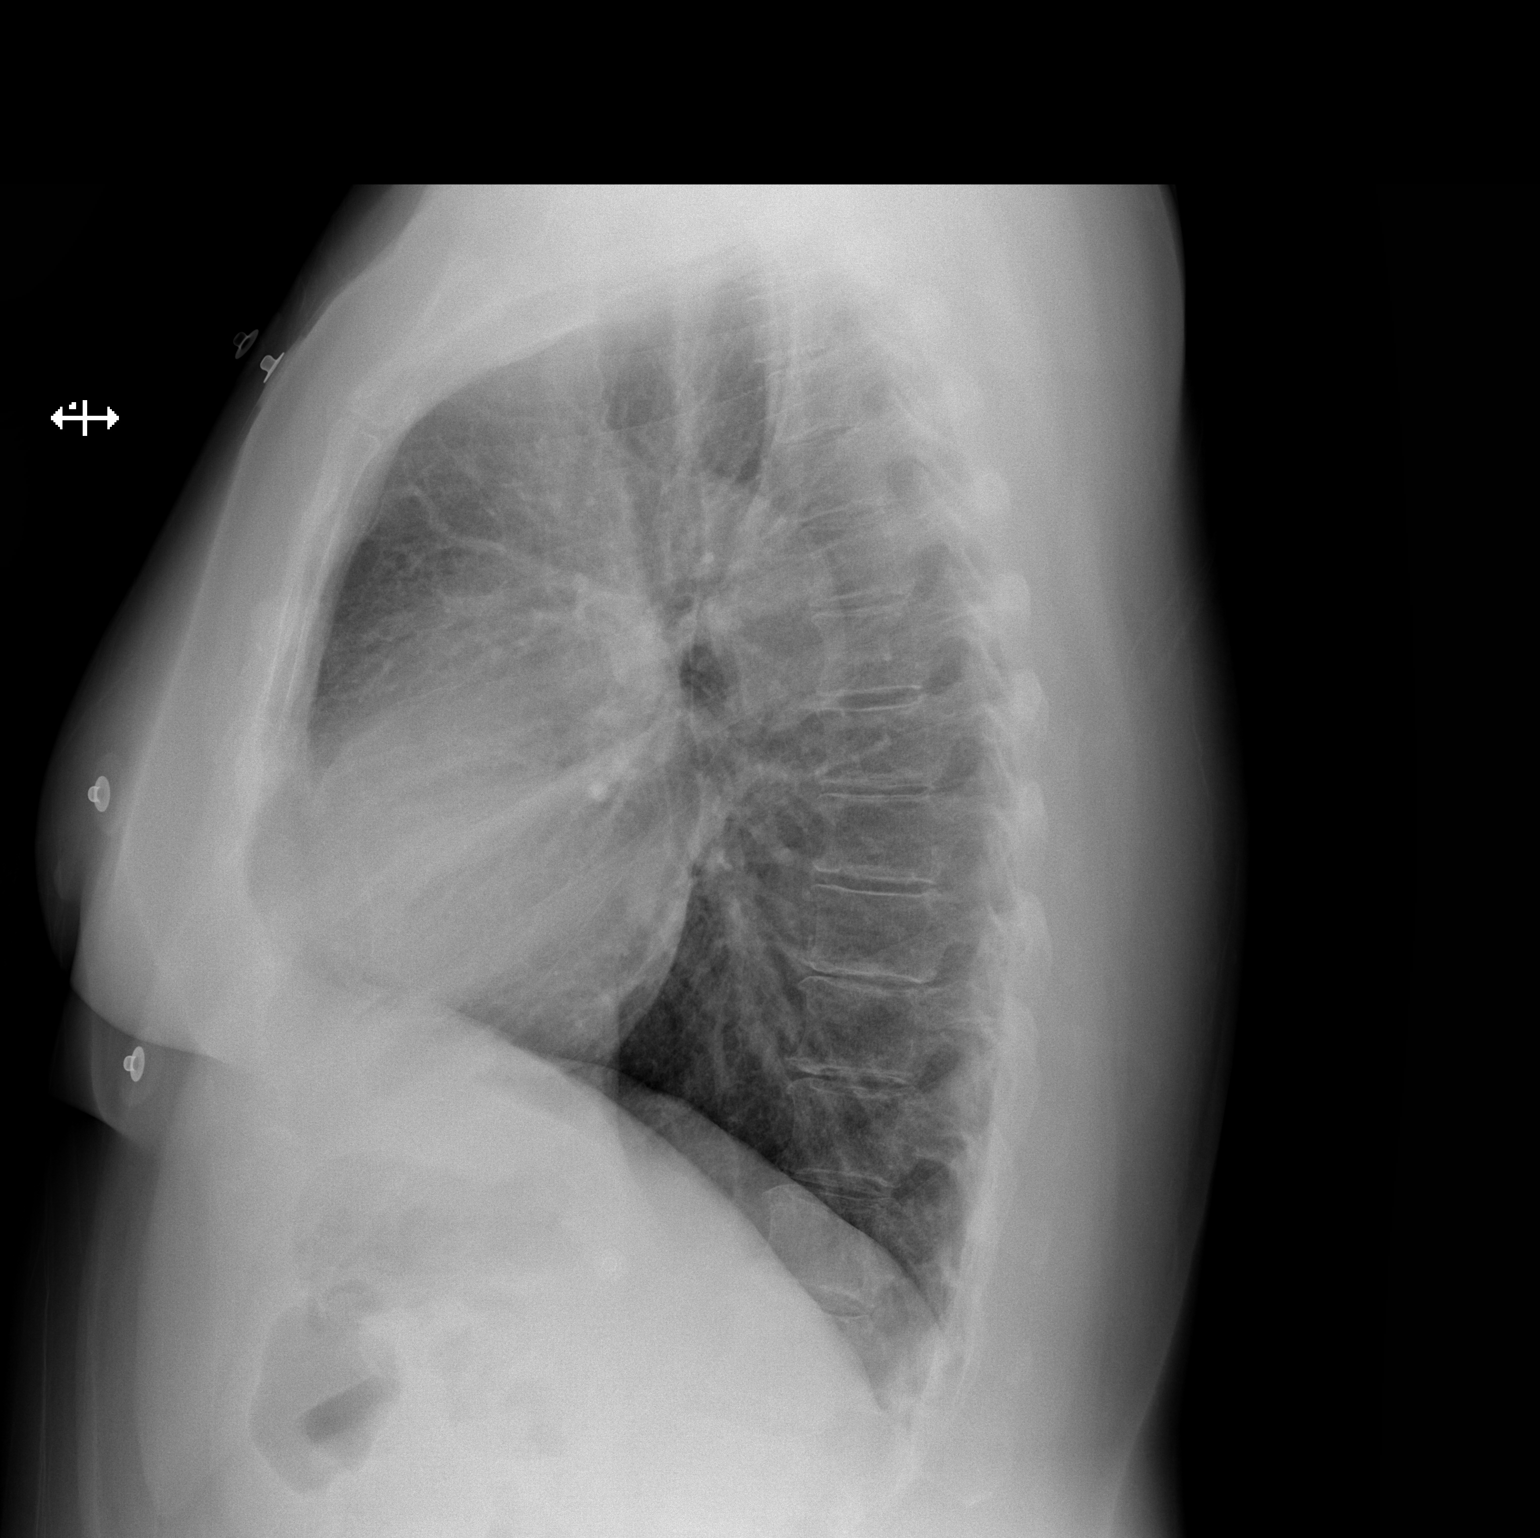

[2 of 2 positions shown; findings below may reference images not displayed]

FINDINGS: The heart size and mediastinal contours are within normal limits.
Both lungs are clear. The visualized skeletal structures are
unremarkable.
IMPRESSION: No active cardiopulmonary disease.

## 2018-11-08 ENCOUNTER — Ambulatory Visit (INDEPENDENT_AMBULATORY_CARE_PROVIDER_SITE_OTHER): Payer: BLUE CROSS/BLUE SHIELD | Admitting: Family

## 2018-11-08 ENCOUNTER — Other Ambulatory Visit: Payer: Self-pay

## 2018-11-08 DIAGNOSIS — R739 Hyperglycemia, unspecified: Secondary | ICD-10-CM | POA: Diagnosis not present

## 2018-11-08 DIAGNOSIS — M8580 Other specified disorders of bone density and structure, unspecified site: Secondary | ICD-10-CM | POA: Diagnosis not present

## 2018-11-08 DIAGNOSIS — E559 Vitamin D deficiency, unspecified: Secondary | ICD-10-CM | POA: Diagnosis not present

## 2018-11-08 DIAGNOSIS — E785 Hyperlipidemia, unspecified: Secondary | ICD-10-CM | POA: Diagnosis not present

## 2018-11-08 NOTE — Progress Notes (Signed)
Virtual Visit via telephone Note  I connected with Chantella Vitali on 11/08/18 at  7:20 AM EDT  Via telephone and verified that I am speaking with the correct person using two identifiers. This visit type was conducted due to national recommendations for restrictions regarding the COVID-19 Pandemic (e.g. social distancing).  This format is felt to be most appropriate for this patient at this time. She did not have equipment to enable a video visit.    I discussed the limitations of evaluation and management by telemedicine and the availability of in person appointments. The patient expressed understanding and agreed to proceed.  Only the patient and myself were on today's video visit. The patient was at home and I was in my office at the time of today's visit.   History of Present Illness:  Patient is a 63 yr old female who presents today for follow up.   Hyperglycemia- ot currently on medications.  Lab Results  Component Value Date   HGBA1C 6.2 08/12/2018   Reports weight is 221 lbs Wt Readings from Last 3 Encounters:  08/12/18 221 lb 3.2 oz (100.3 kg)  05/07/18 218 lb 3.2 oz (99 kg)  02/05/18 217 lb 9.6 oz (98.7 kg)   Hyperlipidemia- continues simvastatin. Tolerates simvastatin  Lab Results  Component Value Date   CHOL 161 05/07/2016   HDL 43.50 05/07/2016   LDLCALC 99 05/07/2016   TRIG 94.0 05/07/2016   CHOLHDL 4 05/07/2016   Vit D deficiency- reports intermittently taking caltrate+ D bid  Intermittently.   Osteopenia- last bone density was 2016.   Depression- maintained on zoloft. Reports that depression is controlled.    Observations/Objective:    Gen: Awake, alert, no acute distress Resp: Breathing is even and non-labored Psych: calm/pleasant demeanor Neuro: Alert and Oriented x 3, + facial symmetry, speech is clear.    Assessment and Plan:Follow Up Instructions:  Hyperlipidemia- obtain follow up lipid panel. Continue statin.  Depression- stable on zoloft,  continue same.   Vit D deficiency- obtain follow up vit D level.  Osteopenia- obtain follow up bone density.     I discussed the assessment and treatment plan with the patient. The patient was provided an opportunity to ask questions and all were answered. The patient agreed with the plan and demonstrated an understanding of the instructions.   The patient was advised to call back or seek an in-person evaluation if the symptoms worsen or if the condition fails to improve as anticipated.    Nance Pear, NP

## 2018-11-10 ENCOUNTER — Ambulatory Visit: Payer: Self-pay | Admitting: Family

## 2018-11-30 ENCOUNTER — Other Ambulatory Visit: Payer: Self-pay | Admitting: Family

## 2018-12-10 ENCOUNTER — Other Ambulatory Visit (INDEPENDENT_AMBULATORY_CARE_PROVIDER_SITE_OTHER): Payer: BC Managed Care – PPO

## 2018-12-10 ENCOUNTER — Other Ambulatory Visit: Payer: Self-pay

## 2018-12-10 ENCOUNTER — Telehealth: Payer: Self-pay | Admitting: Family

## 2018-12-10 DIAGNOSIS — E785 Hyperlipidemia, unspecified: Secondary | ICD-10-CM

## 2018-12-10 DIAGNOSIS — R739 Hyperglycemia, unspecified: Secondary | ICD-10-CM

## 2018-12-10 DIAGNOSIS — E559 Vitamin D deficiency, unspecified: Secondary | ICD-10-CM

## 2018-12-10 LAB — COMPREHENSIVE METABOLIC PANEL
ALT: 27 U/L (ref 0–35)
AST: 19 U/L (ref 0–37)
Albumin: 4.2 g/dL (ref 3.5–5.2)
Alkaline Phosphatase: 62 U/L (ref 39–117)
BUN: 17 mg/dL (ref 6–23)
CO2: 28 mEq/L (ref 19–32)
Calcium: 9 mg/dL (ref 8.4–10.5)
Chloride: 104 mEq/L (ref 96–112)
Creatinine, Ser: 0.75 mg/dL (ref 0.40–1.20)
GFR: 78.04 mL/min (ref >60.00)
Glucose, Bld: 118 mg/dL — ABNORMAL HIGH (ref 70–99)
Potassium: 4.1 mEq/L (ref 3.5–5.1)
Sodium: 141 mEq/L (ref 135–145)
Total Bilirubin: 0.6 mg/dL (ref 0.2–1.2)
Total Protein: 6.6 g/dL (ref 6.0–8.3)

## 2018-12-10 LAB — LIPID PANEL
Cholesterol: 156 mg/dL (ref 0–200)
HDL: 43.4 mg/dL (ref >39.00)
LDL Cholesterol: 90 mg/dL (ref 0–99)
NonHDL: 112.89
Total CHOL/HDL Ratio: 4
Triglycerides: 114 mg/dL (ref 0.0–149.0)
VLDL: 22.8 mg/dL (ref 0.0–40.0)

## 2018-12-10 LAB — HEMOGLOBIN A1C: Hgb A1c MFr Bld: 6.4 % (ref 4.6–6.5)

## 2018-12-10 LAB — VITAMIN D 25 HYDROXY (VIT D DEFICIENCY, FRACTURES): VITD: 24.66 ng/mL — ABNORMAL LOW (ref 30.00–100.00)

## 2018-12-10 MED ORDER — VITAMIN D (ERGOCALCIFEROL) 1.25 MG (50000 UNIT) PO CAPS: 50000.0000 [IU] | ORAL_CAPSULE | ORAL | 0 refills | Status: AC

## 2018-12-10 NOTE — Telephone Encounter (Signed)
Left detail message for patient to be aware of rx and to call me back.

## 2018-12-10 NOTE — Telephone Encounter (Signed)
Please contact pt and let her know that her sugar is at goal.  Vit D is low.  Vitamin D level is low.  Advise patient to begin vit D 50000 units once weekly for 12 weeks, then repeat vit D level (dx Vit D deficiency).

## 2018-12-13 ENCOUNTER — Other Ambulatory Visit: Payer: Self-pay

## 2018-12-13 DIAGNOSIS — E559 Vitamin D deficiency, unspecified: Secondary | ICD-10-CM

## 2018-12-13 NOTE — Telephone Encounter (Signed)
Patient advised of rx and to call back and schedule vit d in 12 weeks.

## 2019-02-07 ENCOUNTER — Ambulatory Visit: Payer: BLUE CROSS/BLUE SHIELD | Admitting: Family

## 2019-02-27 ENCOUNTER — Other Ambulatory Visit: Payer: Self-pay | Admitting: Family

## 2019-03-01 ENCOUNTER — Telehealth: Payer: Self-pay | Admitting: Family

## 2019-03-01 NOTE — Telephone Encounter (Signed)
See my chart message

## 2019-03-06 ENCOUNTER — Other Ambulatory Visit: Payer: Self-pay | Admitting: Family

## 2019-03-10 ENCOUNTER — Other Ambulatory Visit: Payer: Self-pay | Admitting: *Deleted

## 2019-03-10 NOTE — Telephone Encounter (Signed)
Patient needs lab only visit before next fill.  Patient stated that he has to check with ins to make sure they will pay for lab only visit.  She will call and then call us back to let us know.

## 2022-04-15 ENCOUNTER — Encounter: Payer: Self-pay | Admitting: Gastroenterology
